# Patient Record
Sex: Female | Born: 1945 | Race: Asian | Hispanic: No | Marital: Married | Smoking: Never smoker
Health system: Southern US, Community
[De-identification: ages and names within clinical notes are randomized; demographics above are authoritative.]

## PROBLEM LIST (undated history)

## (undated) DIAGNOSIS — I10 Essential (primary) hypertension: Secondary | ICD-10-CM

## (undated) DIAGNOSIS — R17 Unspecified jaundice: Secondary | ICD-10-CM

## (undated) DIAGNOSIS — E782 Mixed hyperlipidemia: Secondary | ICD-10-CM

## (undated) DIAGNOSIS — C259 Malignant neoplasm of pancreas, unspecified: Secondary | ICD-10-CM

## (undated) HISTORY — PX: NO PAST SURGERIES: SHX2092

## (undated) HISTORY — DX: Mixed hyperlipidemia: E78.2

## (undated) HISTORY — DX: Essential (primary) hypertension: I10

## (undated) HISTORY — PX: OTHER SURGICAL HISTORY: SHX169

---

## 1998-03-11 ENCOUNTER — Ambulatory Visit (HOSPITAL_COMMUNITY): Admission: RE | Admit: 1998-03-11 | Discharge: 1998-03-11 | Payer: Self-pay | Admitting: Obstetrics and Gynecology

## 1999-04-09 ENCOUNTER — Encounter: Payer: Self-pay | Admitting: Obstetrics and Gynecology

## 1999-04-09 ENCOUNTER — Ambulatory Visit (HOSPITAL_COMMUNITY): Admission: RE | Admit: 1999-04-09 | Discharge: 1999-04-09 | Payer: Self-pay | Admitting: Obstetrics and Gynecology

## 1999-04-16 ENCOUNTER — Ambulatory Visit (HOSPITAL_COMMUNITY): Admission: RE | Admit: 1999-04-16 | Discharge: 1999-04-16 | Payer: Self-pay | Admitting: Obstetrics and Gynecology

## 1999-04-16 ENCOUNTER — Encounter: Payer: Self-pay | Admitting: Obstetrics and Gynecology

## 1999-04-16 ENCOUNTER — Other Ambulatory Visit: Admission: RE | Admit: 1999-04-16 | Discharge: 1999-04-16 | Payer: Self-pay | Admitting: Obstetrics and Gynecology

## 1999-05-05 ENCOUNTER — Encounter: Payer: Self-pay | Admitting: General Surgery

## 1999-05-05 ENCOUNTER — Ambulatory Visit (HOSPITAL_COMMUNITY): Admission: RE | Admit: 1999-05-05 | Discharge: 1999-05-05 | Payer: Self-pay | Admitting: General Surgery

## 2000-04-28 ENCOUNTER — Other Ambulatory Visit: Admission: RE | Admit: 2000-04-28 | Discharge: 2000-04-28 | Payer: Self-pay | Admitting: Obstetrics and Gynecology

## 2000-05-05 ENCOUNTER — Encounter: Payer: Self-pay | Admitting: Obstetrics and Gynecology

## 2000-05-05 ENCOUNTER — Ambulatory Visit (HOSPITAL_COMMUNITY): Admission: RE | Admit: 2000-05-05 | Discharge: 2000-05-05 | Payer: Self-pay | Admitting: Obstetrics and Gynecology

## 2001-06-08 ENCOUNTER — Encounter: Payer: Self-pay | Admitting: Obstetrics and Gynecology

## 2001-06-08 ENCOUNTER — Ambulatory Visit (HOSPITAL_COMMUNITY): Admission: RE | Admit: 2001-06-08 | Discharge: 2001-06-08 | Payer: Self-pay | Admitting: Obstetrics and Gynecology

## 2001-06-28 ENCOUNTER — Other Ambulatory Visit: Admission: RE | Admit: 2001-06-28 | Discharge: 2001-06-28 | Payer: Self-pay | Admitting: Obstetrics and Gynecology

## 2003-06-01 ENCOUNTER — Ambulatory Visit (HOSPITAL_COMMUNITY): Admission: RE | Admit: 2003-06-01 | Discharge: 2003-06-01 | Payer: Self-pay | Admitting: Obstetrics and Gynecology

## 2003-06-01 ENCOUNTER — Encounter: Payer: Self-pay | Admitting: Obstetrics and Gynecology

## 2005-05-05 ENCOUNTER — Other Ambulatory Visit: Admission: RE | Admit: 2005-05-05 | Discharge: 2005-05-05 | Payer: Self-pay | Admitting: Obstetrics & Gynecology

## 2010-10-18 LAB — LIPID PANEL
Cholesterol: 284 mg/dL — AB (ref 0–200)
HDL: 56 mg/dL (ref 35–70)
LDL Cholesterol: 171 mg/dL
Triglycerides: 286 mg/dL — AB (ref 40–160)

## 2011-08-18 ENCOUNTER — Ambulatory Visit: Payer: Self-pay | Admitting: Family Medicine

## 2011-08-18 DIAGNOSIS — E782 Mixed hyperlipidemia: Secondary | ICD-10-CM

## 2011-08-18 DIAGNOSIS — I10 Essential (primary) hypertension: Secondary | ICD-10-CM

## 2011-08-26 ENCOUNTER — Encounter: Payer: Self-pay | Admitting: Family Medicine

## 2011-08-26 ENCOUNTER — Ambulatory Visit: Payer: Self-pay | Admitting: Family Medicine

## 2011-08-26 DIAGNOSIS — I10 Essential (primary) hypertension: Secondary | ICD-10-CM

## 2011-08-26 DIAGNOSIS — E782 Mixed hyperlipidemia: Secondary | ICD-10-CM | POA: Insufficient documentation

## 2011-10-07 ENCOUNTER — Ambulatory Visit: Payer: Self-pay | Admitting: Family Medicine

## 2011-11-12 ENCOUNTER — Other Ambulatory Visit: Payer: Self-pay | Admitting: Physician Assistant

## 2011-11-23 ENCOUNTER — Other Ambulatory Visit: Payer: Self-pay | Admitting: Physician Assistant

## 2011-11-23 ENCOUNTER — Other Ambulatory Visit: Payer: Self-pay | Admitting: Internal Medicine

## 2014-12-24 DIAGNOSIS — Z1231 Encounter for screening mammogram for malignant neoplasm of breast: Secondary | ICD-10-CM | POA: Diagnosis not present

## 2015-08-27 DIAGNOSIS — Z79891 Long term (current) use of opiate analgesic: Secondary | ICD-10-CM | POA: Diagnosis not present

## 2015-08-27 DIAGNOSIS — E789 Disorder of lipoprotein metabolism, unspecified: Secondary | ICD-10-CM | POA: Diagnosis not present

## 2015-08-27 DIAGNOSIS — R7989 Other specified abnormal findings of blood chemistry: Secondary | ICD-10-CM | POA: Diagnosis not present

## 2015-08-27 DIAGNOSIS — I10 Essential (primary) hypertension: Secondary | ICD-10-CM | POA: Diagnosis not present

## 2015-08-27 DIAGNOSIS — Z79899 Other long term (current) drug therapy: Secondary | ICD-10-CM | POA: Diagnosis not present

## 2015-09-09 ENCOUNTER — Other Ambulatory Visit: Payer: Self-pay | Admitting: Gastroenterology

## 2015-09-09 DIAGNOSIS — R740 Nonspecific elevation of levels of transaminase and lactic acid dehydrogenase [LDH]: Secondary | ICD-10-CM | POA: Diagnosis not present

## 2015-09-09 DIAGNOSIS — R7402 Elevation of levels of lactic acid dehydrogenase (LDH): Secondary | ICD-10-CM

## 2015-09-09 DIAGNOSIS — R17 Unspecified jaundice: Secondary | ICD-10-CM | POA: Diagnosis not present

## 2015-09-09 DIAGNOSIS — L298 Other pruritus: Secondary | ICD-10-CM | POA: Diagnosis not present

## 2015-09-10 ENCOUNTER — Ambulatory Visit
Admission: RE | Admit: 2015-09-10 | Discharge: 2015-09-10 | Disposition: A | Discharge status: Home / Self Care | Payer: Medicare Other | Source: Ambulatory Visit | Attending: Gastroenterology | Admitting: Gastroenterology

## 2015-09-10 DIAGNOSIS — R7402 Elevation of levels of lactic acid dehydrogenase (LDH): Secondary | ICD-10-CM

## 2015-09-10 DIAGNOSIS — R17 Unspecified jaundice: Secondary | ICD-10-CM

## 2015-09-10 DIAGNOSIS — R740 Nonspecific elevation of levels of transaminase and lactic acid dehydrogenase [LDH]: Secondary | ICD-10-CM

## 2015-09-10 DIAGNOSIS — R945 Abnormal results of liver function studies: Secondary | ICD-10-CM | POA: Diagnosis not present

## 2015-09-10 MED ORDER — IOPAMIDOL (ISOVUE-300) INJECTION 61%
100.0000 mL | Freq: Once | INTRAVENOUS | Status: AC | PRN
  Administered 2015-09-10: 100 mL via INTRAVENOUS

## 2015-09-11 ENCOUNTER — Other Ambulatory Visit: Payer: Self-pay | Admitting: Gastroenterology

## 2015-09-11 ENCOUNTER — Encounter (HOSPITAL_COMMUNITY): Payer: Self-pay | Admitting: *Deleted

## 2015-09-11 NOTE — Progress Notes (Signed)
KOREAN INTERPRETER ARRANGED ARRIVE 830 09-13-15 [facility] ADMITTING, EMAIL CONFIRMATION ON CHART

## 2015-09-12 NOTE — Anesthesia Preprocedure Evaluation (Addendum)
Anesthesia Evaluation  Patient identified by MRN, date of birth, ID band Patient awake    Reviewed: Allergy & Precautions, NPO status , Patient's Chart, lab work & pertinent test results  Airway Mallampati: II   Neck ROM: Full    Dental  (+) Dental Advisory Given, Teeth Intact   Pulmonary neg pulmonary ROS,    breath sounds clear to auscultation       Cardiovascular hypertension, Pt. on medications  Rhythm:Regular     Neuro/Psych negative neurological ROS  negative psych ROS   GI/Hepatic negative GI ROS, Neg liver ROS,   Endo/Other  negative endocrine ROSPancreatic mass  Renal/GU negative Renal ROS  negative genitourinary   Musculoskeletal negative musculoskeletal ROS (+)   Abdominal (+)  Abdomen: soft.    Peds negative pediatric ROS (+)  Hematology negative hematology ROS (+)   Anesthesia Other Findings Interpreter present  Reproductive/Obstetrics negative OB ROS                            Anesthesia Physical Anesthesia Plan  ASA: II  Anesthesia Plan: General   Post-op Pain Management:    Induction: Intravenous  Airway Management Planned: Oral ETT  Additional Equipment:   Intra-op Plan:   Post-operative Plan:   Informed Consent: I have reviewed the patients History and Physical, chart, labs and discussed the procedure including the risks, benefits and alternatives for the proposed anesthesia with the patient or authorized representative who has indicated his/her understanding and acceptance.     Plan Discussed with:   Anesthesia Plan Comments:         Anesthesia Quick Evaluation

## 2015-09-13 ENCOUNTER — Ambulatory Visit (HOSPITAL_COMMUNITY): Payer: Medicare Other | Admitting: Anesthesiology

## 2015-09-13 ENCOUNTER — Encounter (HOSPITAL_COMMUNITY): Payer: Self-pay | Admitting: Registered Nurse

## 2015-09-13 ENCOUNTER — Ambulatory Visit (HOSPITAL_COMMUNITY): Payer: Medicare Other

## 2015-09-13 ENCOUNTER — Ambulatory Visit (HOSPITAL_COMMUNITY)
Admission: RE | Admit: 2015-09-13 | Discharge: 2015-09-13 | Disposition: A | Discharge status: Home / Self Care | Payer: Medicare Other | Source: Ambulatory Visit | Attending: Gastroenterology | Admitting: Gastroenterology

## 2015-09-13 ENCOUNTER — Encounter (HOSPITAL_COMMUNITY)
Admission: RE | Disposition: A | Discharge status: Home / Self Care | Payer: Self-pay | Source: Ambulatory Visit | Attending: Gastroenterology

## 2015-09-13 ENCOUNTER — Encounter (HOSPITAL_COMMUNITY): Payer: Self-pay | Admitting: *Deleted

## 2015-09-13 DIAGNOSIS — E782 Mixed hyperlipidemia: Secondary | ICD-10-CM | POA: Diagnosis not present

## 2015-09-13 DIAGNOSIS — I10 Essential (primary) hypertension: Secondary | ICD-10-CM | POA: Insufficient documentation

## 2015-09-13 DIAGNOSIS — K831 Obstruction of bile duct: Secondary | ICD-10-CM | POA: Diagnosis not present

## 2015-09-13 DIAGNOSIS — R932 Abnormal findings on diagnostic imaging of liver and biliary tract: Secondary | ICD-10-CM | POA: Diagnosis not present

## 2015-09-13 DIAGNOSIS — L298 Other pruritus: Secondary | ICD-10-CM | POA: Diagnosis not present

## 2015-09-13 DIAGNOSIS — K8689 Other specified diseases of pancreas: Secondary | ICD-10-CM | POA: Diagnosis not present

## 2015-09-13 DIAGNOSIS — R17 Unspecified jaundice: Secondary | ICD-10-CM | POA: Diagnosis not present

## 2015-09-13 HISTORY — DX: Unspecified jaundice: R17

## 2015-09-13 HISTORY — PX: ERCP: SHX5425

## 2015-09-13 SURGERY — ERCP, WITH INTERVENTION IF INDICATED
Anesthesia: General

## 2015-09-13 MED ORDER — IOHEXOL 350 MG/ML SOLN
INTRAVENOUS | Status: DC | PRN
  Administered 2015-09-13: 30 mL

## 2015-09-13 MED ORDER — CIPROFLOXACIN IN D5W 400 MG/200ML IV SOLN
INTRAVENOUS | Status: AC
  Filled 2015-09-13: qty 200

## 2015-09-13 MED ORDER — INDOMETHACIN 50 MG RE SUPP
RECTAL | Status: AC
  Filled 2015-09-13: qty 2

## 2015-09-13 MED ORDER — LIDOCAINE HCL (CARDIAC) 20 MG/ML IV SOLN
INTRAVENOUS | Status: DC | PRN
  Administered 2015-09-13: 25 mg via INTRATRACHEAL
  Administered 2015-09-13: 75 mg via INTRAVENOUS

## 2015-09-13 MED ORDER — LACTATED RINGERS IV SOLN
INTRAVENOUS | Status: DC | PRN
  Administered 2015-09-13 (×2): via INTRAVENOUS

## 2015-09-13 MED ORDER — PROPOFOL 10 MG/ML IV BOLUS
INTRAVENOUS | Status: AC
  Filled 2015-09-13: qty 60

## 2015-09-13 MED ORDER — SUCCINYLCHOLINE CHLORIDE 20 MG/ML IJ SOLN
INTRAMUSCULAR | Status: DC | PRN
  Administered 2015-09-13: 80 mg via INTRAVENOUS

## 2015-09-13 MED ORDER — ONDANSETRON HCL 4 MG/2ML IJ SOLN
INTRAMUSCULAR | Status: DC | PRN
  Administered 2015-09-13: 4 mg via INTRAVENOUS

## 2015-09-13 MED ORDER — CIPROFLOXACIN IN D5W 400 MG/200ML IV SOLN
INTRAVENOUS | Status: DC | PRN
  Administered 2015-09-13: 400 mg via INTRAVENOUS

## 2015-09-13 MED ORDER — INDOMETHACIN 50 MG RE SUPP
100.0000 mg | Freq: Once | RECTAL | Status: AC
  Administered 2015-09-13: 100 mg via RECTAL

## 2015-09-13 MED ORDER — SODIUM CHLORIDE 0.9 % IV SOLN: INTRAVENOUS | Status: DC

## 2015-09-13 MED ORDER — PROPOFOL 10 MG/ML IV BOLUS
INTRAVENOUS | Status: DC | PRN
  Administered 2015-09-13: 150 mg via INTRAVENOUS

## 2015-09-13 MED ORDER — GLUCAGON HCL RDNA (DIAGNOSTIC) 1 MG IJ SOLR
INTRAMUSCULAR | Status: AC
  Filled 2015-09-13: qty 1

## 2015-09-13 MED ORDER — LACTATED RINGERS IV SOLN
INTRAVENOUS | Status: DC
  Administered 2015-09-13: 1000 mL via INTRAVENOUS

## 2015-09-13 NOTE — Transfer of Care (Signed)
Immediate Anesthesia Transfer of Care Note  Patient: Kristin Valentine  Procedure(s) Performed: Procedure(s): ENDOSCOPIC RETROGRADE CHOLANGIOPANCREATOGRAPHY (ERCP) (N/A)  Patient Location: PACU  Anesthesia Type:General  Level of Consciousness: awake, alert  and patient cooperative  Airway & Oxygen Therapy: Patient Spontanous Breathing and Patient connected to face mask oxygen  Post-op Assessment: Report given to RN and Post -op Vital signs reviewed and stable  Post vital signs: Reviewed and stable  Last Vitals:  Filed Vitals:   09/13/15 0907  BP: 158/102  Pulse: 94  Temp: 36.6 C  Resp: 21    Complications: No apparent anesthesia complications

## 2015-09-13 NOTE — Op Note (Signed)
[facility] [place], [address]   ERCP PROCEDURE REPORT        EXAM DATE: September 30, 2015  PATIENT NAME:          Kristin Valentine, Kristin Valentine          MR #:        [id number]  BIRTHDATE:       [birth year]     VISIT #:     [id number] ATTENDING:     Carol Ada, MD     STATUS:     outpatient ASSISTANT:      Markus Daft, and Jiles Harold  INDICATIONS:  The patient is a 70 yr old female here for an ERCP due to obstructive jaundice PROCEDURE PERFORMED:     ERCP with stent placement MEDICATIONS:     General Anesthesia, Cipro 400 mg IV, and Indomethacin 100 mg PR  CONSENT: The patient understands the risks and benefits of the procedure and understands that these risks include, but are not limited to: sedation, allergic reaction, infection, perforation and/or bleeding. Alternative means of evaluation and treatment include, among others: physical exam, x-rays, and/or surgical intervention. The patient elects to proceed with this endoscopic procedure.  DESCRIPTION OF PROCEDURE: During intra-op preparation period all mechanical & medical equipment was checked for proper function. Hand hygiene and appropriate measures for infection prevention was taken. After the risks, benefits and alternatives of the procedure were thoroughly explained, Informed was verified, confirmed and timeout was successfully executed by the treatment team. With the patient in left semi-prone position, medications were administered intravenously.The    was passed from the mouth into the esophagus and further advanced from the esophagus into the stomach. From stomach scope was directed to the second portion of the duodenum. Major papilla was aligned with the duodenoscope. The scope position was confirmed fluoroscopically. Rest of the findings/therapeutics are given below. The scope was then completely withdrawn from the patient and the procedure completed. The pulse,  BP, and O2 saturation were monitored and documented by the physician and the nursing staff throughout the entire procedure. The patient was cared for as planned according to standard protocol. The patient was then discharged to recovery in stable condition and with appropriate post procedure care. Estimated blood loss is zero unless otherwise noted in this procedure report.  The ampulla was located the second portion of the duodenum and there was a periampullary diverticulum.  After several attempts the PD was cannulated with the guidewire and left in the PD.  A sphincterotomy was created and then a secondary guidewire was used. with gentle manipulation and maneuvering the CBD was successfully cannulated.  The guidewire was advanced to the proximal CBD.  It was not able to be advanced further as the wire continued to loop. Contrast injection revealed a dilated proximal and mid CBD to 2 cm. The sphincterotomy was extended and then an 8.5 Fr x 5 cm plastic bilary stent was inserted without difficulty.  Immediate drainage of the CBD was achieved.  Fluoroscopy documented excellent placement of the stent across the extrinsic obstruction.    ADVERSE EVENT:     There were no immediate complications. IMPRESSIONS:     1) Extrinsic compression of the CBD s/p successful 8.5 Fr x 5 cm biliary stent placement.  RECOMMENDATIONS:     1) Schedule EUS with FNA. REPEAT EXAM:   ___________________________________ Carol Ada, MD eSigned:  Carol Ada, MD 09/30/15 11:06 AM   cc:  CPT CODES: ICD9 CODES:

## 2015-09-13 NOTE — Anesthesia Postprocedure Evaluation (Signed)
Anesthesia Post Note  Patient: Kristin Valentine  Procedure(s) Performed: Procedure(s) (LRB): ENDOSCOPIC RETROGRADE CHOLANGIOPANCREATOGRAPHY (ERCP) (N/A)  Patient location during evaluation: PACU Anesthesia Type: General Level of consciousness: awake and alert Pain management: pain level controlled Vital Signs Assessment: post-procedure vital signs reviewed and stable Respiratory status: spontaneous breathing, nonlabored ventilation, respiratory function stable and patient connected to nasal cannula oxygen Cardiovascular status: blood pressure returned to baseline and stable Postop Assessment: no signs of nausea or vomiting Anesthetic complications: no    Last Vitals:  Filed Vitals:   09/13/15 1120 09/13/15 1130  BP: 154/92 149/95  Pulse: 71 68  Temp:    Resp: 16 16    Last Pain: There were no vitals filed for this visit.               Demisha Nokes

## 2015-09-13 NOTE — Anesthesia Procedure Notes (Signed)
Procedure Name: Intubation Date/Time: 09/13/2015 10:09 AM Performed by: Lissa Morales Pre-anesthesia Checklist: Patient identified, Emergency Drugs available, Suction available and Patient being monitored Patient Re-evaluated:Patient Re-evaluated prior to inductionOxygen Delivery Method: Circle System Utilized Preoxygenation: Pre-oxygenation with 100% oxygen Intubation Type: IV induction Ventilation: Mask ventilation without difficulty Laryngoscope Size: Mac and 4 Grade View: Grade II Tube type: Oral Number of attempts: 1 Airway Equipment and Method: Stylet and Oral airway Placement Confirmation: ETT inserted through vocal cords under direct vision,  positive ETCO2 and breath sounds checked- equal and bilateral Secured at: 20 cm Tube secured with: Tape Dental Injury: Teeth and Oropharynx as per pre-operative assessment

## 2015-09-13 NOTE — H&P (Signed)
  Kristin Valentine HPI: This is a 70 year old female with painless jaundice.  Initially she presented with abnormal liver enzymes and then she quickly developed pruritis.  Repeat blood work confirmed the elevation in her bilirubin at 7.0.  CT scan reveals a double duct sign and a subtle hypoenhancing mass in the head of the pancreas.  No reports of any fever.  Past Medical History  Diagnosis Date  . HTN (hypertension)   . Hyperlipidemia, mixed   . Yellow skin for last week    Past Surgical History  Procedure Laterality Date  . No past surgeries      Family History  Problem Relation Age of Onset  . Hypertension Mother     Social History:  reports that she has never smoked. She has never used smokeless tobacco. She reports that she does not drink alcohol or use illicit drugs.  Allergies: No Known Allergies  Medications:  Scheduled:  Continuous: . sodium chloride    . lactated ringers      No results found for this or any previous visit (from the past 24 hour(s)).   No results found.  ROS:  As stated above in the HPI otherwise negative.  There were no vitals taken for this visit.    PE: Gen: NAD, Alert and Oriented HEENT:  [place]/AT, EOMI Neck: Supple, no LAD Lungs: CTA Bilaterally CV: RRR without M/G/R ABM: Soft, NTND, +BS Ext: No C/C/E  Assessment/Plan: 1) Obstructive jaundice.  Plan: 1) ERCP with stenting.  Kae Lauman D 09/13/2015, 9:04 AM

## 2015-09-16 ENCOUNTER — Encounter (HOSPITAL_COMMUNITY): Payer: Self-pay | Admitting: Gastroenterology

## 2015-09-19 ENCOUNTER — Encounter (HOSPITAL_COMMUNITY): Payer: Self-pay

## 2015-09-19 ENCOUNTER — Ambulatory Visit (HOSPITAL_COMMUNITY)
Admission: RE | Admit: 2015-09-19 | Discharge: 2015-09-19 | Disposition: A | Discharge status: Home / Self Care | Payer: Medicare Other | Source: Ambulatory Visit | Attending: Gastroenterology | Admitting: Gastroenterology

## 2015-09-19 ENCOUNTER — Ambulatory Visit (HOSPITAL_COMMUNITY): Payer: Medicare Other | Admitting: Certified Registered"

## 2015-09-19 ENCOUNTER — Encounter (HOSPITAL_COMMUNITY)
Admission: RE | Disposition: A | Discharge status: Home / Self Care | Payer: Self-pay | Source: Ambulatory Visit | Attending: Gastroenterology

## 2015-09-19 DIAGNOSIS — I10 Essential (primary) hypertension: Secondary | ICD-10-CM | POA: Insufficient documentation

## 2015-09-19 DIAGNOSIS — R17 Unspecified jaundice: Secondary | ICD-10-CM | POA: Diagnosis not present

## 2015-09-19 DIAGNOSIS — K831 Obstruction of bile duct: Secondary | ICD-10-CM | POA: Insufficient documentation

## 2015-09-19 DIAGNOSIS — R932 Abnormal findings on diagnostic imaging of liver and biliary tract: Secondary | ICD-10-CM | POA: Diagnosis not present

## 2015-09-19 DIAGNOSIS — C250 Malignant neoplasm of head of pancreas: Secondary | ICD-10-CM | POA: Diagnosis not present

## 2015-09-19 DIAGNOSIS — E782 Mixed hyperlipidemia: Secondary | ICD-10-CM | POA: Insufficient documentation

## 2015-09-19 DIAGNOSIS — K8689 Other specified diseases of pancreas: Secondary | ICD-10-CM | POA: Diagnosis not present

## 2015-09-19 HISTORY — PX: EUS: SHX5427

## 2015-09-19 HISTORY — PX: FINE NEEDLE ASPIRATION: SHX5430

## 2015-09-19 SURGERY — ESOPHAGEAL ENDOSCOPIC ULTRASOUND (EUS) RADIAL
Anesthesia: Monitor Anesthesia Care

## 2015-09-19 MED ORDER — PROPOFOL 500 MG/50ML IV EMUL
INTRAVENOUS | Status: DC | PRN
  Administered 2015-09-19: 150 ug/kg/min via INTRAVENOUS

## 2015-09-19 MED ORDER — PROPOFOL 10 MG/ML IV BOLUS
INTRAVENOUS | Status: AC
  Filled 2015-09-19: qty 20

## 2015-09-19 MED ORDER — LIDOCAINE HCL (CARDIAC) 20 MG/ML IV SOLN
INTRAVENOUS | Status: DC | PRN
  Administered 2015-09-19 (×3): 40 mg via INTRAVENOUS
  Administered 2015-09-19: 20 mg via INTRAVENOUS

## 2015-09-19 MED ORDER — PROPOFOL 10 MG/ML IV BOLUS
INTRAVENOUS | Status: DC | PRN
  Administered 2015-09-19: 50 mg via INTRAVENOUS
  Administered 2015-09-19: 40 mg via INTRAVENOUS

## 2015-09-19 MED ORDER — ONDANSETRON HCL 4 MG/2ML IJ SOLN
INTRAMUSCULAR | Status: DC | PRN
  Administered 2015-09-19 (×2): 2 mg via INTRAVENOUS

## 2015-09-19 MED ORDER — FENTANYL CITRATE (PF) 100 MCG/2ML IJ SOLN: 25.0000 ug | INTRAMUSCULAR | Status: DC | PRN

## 2015-09-19 MED ORDER — LIDOCAINE HCL (CARDIAC) 20 MG/ML IV SOLN
INTRAVENOUS | Status: AC
  Filled 2015-09-19: qty 5

## 2015-09-19 MED ORDER — ONDANSETRON HCL 4 MG/2ML IJ SOLN
INTRAMUSCULAR | Status: AC
  Filled 2015-09-19: qty 2

## 2015-09-19 MED ORDER — LACTATED RINGERS IV SOLN
INTRAVENOUS | Status: DC
  Administered 2015-09-19: 15:00:00 via INTRAVENOUS
  Administered 2015-09-19: 1000 mL via INTRAVENOUS

## 2015-09-19 NOTE — Anesthesia Preprocedure Evaluation (Signed)
Anesthesia Evaluation  Patient identified by MRN, date of birth, ID band Patient awake    Reviewed: Allergy & Precautions, NPO status , Patient's Chart, lab work & pertinent test results  Airway Mallampati: II   Neck ROM: Full    Dental  (+) Dental Advisory Given, Teeth Intact   Pulmonary neg pulmonary ROS,    Pulmonary exam normal breath sounds clear to auscultation       Cardiovascular hypertension, Pt. on medications Normal cardiovascular exam Rhythm:Regular     Neuro/Psych negative neurological ROS  negative psych ROS   GI/Hepatic negative GI ROS, Neg liver ROS,   Endo/Other  negative endocrine ROSPancreatic mass  Renal/GU negative Renal ROS  negative genitourinary   Musculoskeletal negative musculoskeletal ROS (+)   Abdominal (+)  Abdomen: soft.    Peds negative pediatric ROS (+)  Hematology negative hematology ROS (+)   Anesthesia Other Findings Interpreter present  Reproductive/Obstetrics negative OB ROS                             Anesthesia Physical Anesthesia Plan  ASA: II  Anesthesia Plan: MAC   Post-op Pain Management:    Induction:   Airway Management Planned:   Additional Equipment:   Intra-op Plan:   Post-operative Plan:   Informed Consent:   Plan Discussed with:   Anesthesia Plan Comments:         Anesthesia Quick Evaluation

## 2015-09-19 NOTE — Transfer of Care (Signed)
Immediate Anesthesia Transfer of Care Note  Patient: Kristin Valentine  Procedure(s) Performed: Procedure(s): ESOPHAGEAL ENDOSCOPIC ULTRASOUND (EUS) RADIAL (N/A) FINE NEEDLE ASPIRATION (FNA) LINEAR (N/A)  Patient Location: PACU and Endoscopy Unit  Anesthesia Type:MAC  Level of Consciousness: awake and patient cooperative  Airway & Oxygen Therapy: Patient Spontanous Breathing and Patient connected to nasal cannula oxygen  Post-op Assessment: Report given to RN and Post -op Vital signs reviewed and stable  Post vital signs: Reviewed and stable  Last Vitals:  Filed Vitals:   09/19/15 1522 09/19/15 1523  BP: 78/48 85/50  Pulse: 81   Temp: 36.7 C   Resp: 21     Complications: No apparent anesthesia complications

## 2015-09-19 NOTE — Discharge Instructions (Signed)

## 2015-09-19 NOTE — Op Note (Signed)
[facility] [place], [address]   UPPER ENDOSCOPIC ULTRASOUND PROCEDURE REPORT     EXAM DATE: 09/19/2015  PATIENT NAME:          Kristin Valentine, Kristin Valentine          MR#:        [id number]  BIRTHDATE:       [birth year]     VISIT #:     [phone] ATTENDING:     Carol Ada, MD     STATUS:     outpatient ASSISTANT:      Cherylynn Ridges and Carolynn Comment  REFERRING MD: ASA CLASS:        Class II  INDICATIONS:  The patient is a 70 yr old female here for a lower endoscopic ultrasound due to obstructive jaundice. PROCEDURE PERFORMED:     Upper EUS w/FNA  MEDICATIONS:     Monitored anesthesia care  CONSENT: The patient understands the risks and benefits of the procedure and understands that these risks include, but are not limited to: sedation, allergic reaction, infection, perforation and/or bleeding. Alternative means of evaluation and treatment include, among others: physical exam, x-rays, and/or surgical intervention. The patient elects to proceed with this endoscopic procedure.  DESCRIPTION OF PROCEDURE: During intra-op preparation period all mechanical & medical equipment was checked for proper function. Hand hygiene and appropriate measures for infection prevention was taken. After the risks, benefits and alternatives of the procedure were thoroughly explained, Informed consent was verified, confirmed and timeout was successfully executed by the treatment team. The patient was then placed in the left, lateral, decubitus position and IV sedation was administered. Throughout the procedure, the patients blood pressure, pulse and oxygen saturations were monitored continuously. Under direct visualization, the [id number]) was introduced through the mouth and advanced to the second portion of the duodenum.  Water was used as necessary to provide an acoustic interface. The pulse, BP, and O2 saturation were monitored and documented by the  physician and nursing staff throughout the procedure. Upon completion of the imaging, water was removed and the patient was then discharged to recovery in stable condition with the appropriate post procedure care. Estimated blood loss is zero unless otherwise noted in this procedure report.   FINDINGS: Technical difficulties precluded obtaining ultrasound views during the procedure.  The echoendoscope was passed into the gastric lumen.  Endoechoscopic evaluation revealed a normal Celiac axis.  The left lobe of the liver was negative for any evidence of metastases.  An attempt was made to evaluate the left adrenal gland, but it was not able to be identified.  The pancreatic blody and tail of the pancreas exhibited a marked dilation of the PD at 6 mm.  From the duodenal bulb the PD dilation was again identified. It was difficult to localize the CBD from this location.  The scope was then passed into the second portion of the duodenum.  The plastic biliary stent was in place and good drainage was observed, however, the stent appeared to have slipped down.  On the ultrasound view a large irregularly bordered hypoechoic mass was identified.  The measurements ranged from 3 x 3 cm up to 3 x 6 cm. It was difficult to discern the margins of the mass in certain spots.  The biliary stent was identified and it was tracking through the mass in the CBD.  The mass was located in the uncinate/pancreatic head.  There was no evidence of vascular invasion into the portal vein.  A 22 gauge FNA needle was used and an excellent sample was obtained.  Doppler was used to ensure no significant vessels were enountered and the depth of needle insertion was measured before the FNA.  Shortly afterwards a significant amount of fresh blood was identified.  The echoendoscope was changed out for an EGD scope and a large fresh clot was found in the duodenal bulb.  Using a snare the clot was broken down and suctioned.   A large portion was able to pulled into the gastric lumen.  Reinspection of the duodenal bulb was negative for a bleeding source and then the scope was advanced to the second portion of the duodenum.  A smaller clot was found.  At this point a duodenoscope was employed to obtain a better visualization.  Once the periampullary clot was removed a clean-based ulcer was found at the ampulla.  This may have been from stent flange irritation.  No further bleeding recurred.  The decision was then made to resume the EUS/FNA.  Using a 25 gauge FNA needle four additional passes into the mass were performed and excellent samples were obtained. Because of the bleed, the EGD scope was reinserted after the EUS/FNA to ensure that there was no further bleeding, which was the case.    ADVERSE EVENTS:     There were no immediate complications. IMPRESSIONS:     1) 3 x 3 cm and potentially 3 x 6 cm irregularly bordered hypoechoic mass in the uncinate/head of the pancreas.   RECOMMENDATIONS:     1) Await FNA results.  ___________________________________ Carol Ada, MD eSigned:  Carol Ada, MD 09/19/2015 3:39 PM   cc:      PATIENT NAME:  [name], [name] MR#: [id number]

## 2015-09-19 NOTE — Anesthesia Postprocedure Evaluation (Signed)
Anesthesia Post Note  Patient: Kristin Valentine  Procedure(s) Performed: Procedure(s) (LRB): ESOPHAGEAL ENDOSCOPIC ULTRASOUND (EUS) RADIAL (N/A) FINE NEEDLE ASPIRATION (FNA) LINEAR (N/A)  Patient location during evaluation: PACU Anesthesia Type: MAC Level of consciousness: awake and alert Pain management: pain level controlled Vital Signs Assessment: post-procedure vital signs reviewed and stable Respiratory status: spontaneous breathing Cardiovascular status: blood pressure returned to baseline Anesthetic complications: no    Last Vitals:  Filed Vitals:   09/19/15 1540 09/19/15 1550  BP: 118/72 118/74  Pulse: 80 83  Temp:    Resp: 26 16    Last Pain: There were no vitals filed for this visit.               Tiajuana Amass

## 2015-09-19 NOTE — H&P (View-Only) (Signed)
  Kristin Valentine HPI: This is a 70 year old female with painless jaundice.  Initially she presented with abnormal liver enzymes and then she quickly developed pruritis.  Repeat blood work confirmed the elevation in her bilirubin at 7.0.  CT scan reveals a double duct sign and a subtle hypoenhancing mass in the head of the pancreas.  No reports of any fever.  Past Medical History  Diagnosis Date  . HTN (hypertension)   . Hyperlipidemia, mixed   . Yellow skin for last week    Past Surgical History  Procedure Laterality Date  . No past surgeries      Family History  Problem Relation Age of Onset  . Hypertension Mother     Social History:  reports that she has never smoked. She has never used smokeless tobacco. She reports that she does not drink alcohol or use illicit drugs.  Allergies: No Known Allergies  Medications:  Scheduled:  Continuous: . sodium chloride    . lactated ringers      No results found for this or any previous visit (from the past 24 hour(s)).   No results found.  ROS:  As stated above in the HPI otherwise negative.  There were no vitals taken for this visit.    PE: Gen: NAD, Alert and Oriented HEENT:  [place]/AT, EOMI Neck: Supple, no LAD Lungs: CTA Bilaterally CV: RRR without M/G/R ABM: Soft, NTND, +BS Ext: No C/C/E  Assessment/Plan: 1) Obstructive jaundice.  Plan: 1) ERCP with stenting.  Kristin Valentine D 09/13/2015, 9:04 AM

## 2015-09-19 NOTE — Interval H&P Note (Signed)
History and Physical Interval Note:  09/19/2015 1:18 PM  Kristin Valentine  has presented today for surgery, with the diagnosis of pancreatic head mass   The various methods of treatment have been discussed with the patient and family. After consideration of risks, benefits and other options for treatment, the patient has consented to  Procedure(s): ESOPHAGEAL ENDOSCOPIC ULTRASOUND (EUS) RADIAL (N/A) FINE NEEDLE ASPIRATION (FNA) LINEAR (N/A) as a surgical intervention .  The patient's history has been reviewed, patient examined, no change in status, stable for surgery.  I have reviewed the patient's chart and labs.  Questions were answered to the patient's satisfaction.     [name] D

## 2015-09-20 ENCOUNTER — Encounter (HOSPITAL_COMMUNITY): Payer: Self-pay | Admitting: Gastroenterology

## 2015-09-23 DIAGNOSIS — C250 Malignant neoplasm of head of pancreas: Secondary | ICD-10-CM | POA: Diagnosis not present

## 2015-09-24 DIAGNOSIS — C250 Malignant neoplasm of head of pancreas: Secondary | ICD-10-CM | POA: Diagnosis not present

## 2015-09-30 ENCOUNTER — Emergency Department (HOSPITAL_COMMUNITY): Payer: Medicare Other

## 2015-09-30 ENCOUNTER — Encounter (HOSPITAL_COMMUNITY): Payer: Self-pay | Admitting: Emergency Medicine

## 2015-09-30 ENCOUNTER — Emergency Department (HOSPITAL_COMMUNITY)
Admission: EM | Admit: 2015-09-30 | Discharge: 2015-09-30 | Disposition: A | Discharge status: Home / Self Care | Payer: Medicare Other | Source: Home / Self Care | Attending: Emergency Medicine | Admitting: Emergency Medicine

## 2015-09-30 DIAGNOSIS — I10 Essential (primary) hypertension: Secondary | ICD-10-CM

## 2015-09-30 DIAGNOSIS — R05 Cough: Secondary | ICD-10-CM

## 2015-09-30 DIAGNOSIS — C259 Malignant neoplasm of pancreas, unspecified: Secondary | ICD-10-CM | POA: Insufficient documentation

## 2015-09-30 HISTORY — DX: Malignant neoplasm of pancreas, unspecified: C25.9

## 2015-09-30 LAB — COMPREHENSIVE METABOLIC PANEL
ALBUMIN: 3.3 g/dL — AB (ref 3.5–5.0)
ALT: 158 U/L — AB (ref 14–54)
AST: 91 U/L — AB (ref 15–41)
Alkaline Phosphatase: 567 U/L — ABNORMAL HIGH (ref 38–126)
Anion gap: 13 (ref 5–15)
BUN: 16 mg/dL (ref 6–20)
CHLORIDE: 87 mmol/L — AB (ref 101–111)
CO2: 24 mmol/L (ref 22–32)
CREATININE: 0.33 mg/dL — AB (ref 0.44–1.00)
Calcium: 8.8 mg/dL — ABNORMAL LOW (ref 8.9–10.3)
GFR calc Af Amer: >60 mL/min (ref >60)
GFR calc non Af Amer: >60 mL/min (ref >60)
Glucose, Bld: 173 mg/dL — ABNORMAL HIGH (ref 65–99)
Potassium: 3.5 mmol/L (ref 3.5–5.1)
SODIUM: 124 mmol/L — AB (ref 135–145)
Total Bilirubin: 10.6 mg/dL — ABNORMAL HIGH (ref 0.3–1.2)
Total Protein: 7.4 g/dL (ref 6.5–8.1)

## 2015-09-30 LAB — I-STAT CG4 LACTIC ACID, ED: LACTIC ACID, VENOUS: 1.08 mmol/L (ref 0.5–2.0)

## 2015-09-30 NOTE — ED Notes (Signed)
Attempted to call patient for assigned room. No answer.

## 2015-09-30 NOTE — ED Notes (Signed)
Bed: [id number] Expected date:  Expected time:  Means of arrival:  Comments: Renato Gails

## 2015-09-30 NOTE — ED Notes (Signed)
Pt's daughter called and is concerned that the Pt is having to wait.  This Agricultural consultant confirmed that the Pt does not receive chemo or radiation for CA diagnosis.  Pt's daughter demanding a time frame that the Pt will be seen.  Daughter informed of department status/census and that this Charge RN is unable to give a specific wait time.  Protocols ordered.

## 2015-09-30 NOTE — ED Notes (Signed)
Pt called for 3rd time with no response.

## 2015-09-30 NOTE — ED Notes (Signed)
Pt states that since Saturday she has had chest congestion and a cough along with chills. Afebrile. Pt recently dx with pancreatic CA. Alert and oriented.

## 2015-09-30 NOTE — ED Notes (Signed)
Attempted to call Pt to room w/o answer.

## 2015-10-01 ENCOUNTER — Telehealth (HOSPITAL_COMMUNITY): Payer: Self-pay | Admitting: *Deleted

## 2015-10-01 ENCOUNTER — Inpatient Hospital Stay (HOSPITAL_COMMUNITY)
Admission: EM | Admit: 2015-10-01 | Discharge: 2015-10-03 | DRG: 445 | Disposition: A | Discharge status: Home / Self Care | Payer: Medicare Other | Attending: Internal Medicine | Admitting: Internal Medicine

## 2015-10-01 DIAGNOSIS — K831 Obstruction of bile duct: Principal | ICD-10-CM | POA: Diagnosis present

## 2015-10-01 DIAGNOSIS — R7989 Other specified abnormal findings of blood chemistry: Secondary | ICD-10-CM | POA: Diagnosis not present

## 2015-10-01 DIAGNOSIS — E871 Hypo-osmolality and hyponatremia: Secondary | ICD-10-CM | POA: Diagnosis not present

## 2015-10-01 DIAGNOSIS — C259 Malignant neoplasm of pancreas, unspecified: Secondary | ICD-10-CM | POA: Diagnosis not present

## 2015-10-01 DIAGNOSIS — R17 Unspecified jaundice: Secondary | ICD-10-CM | POA: Diagnosis present

## 2015-10-01 DIAGNOSIS — R740 Nonspecific elevation of levels of transaminase and lactic acid dehydrogenase [LDH]: Secondary | ICD-10-CM | POA: Diagnosis not present

## 2015-10-01 DIAGNOSIS — L299 Pruritus, unspecified: Secondary | ICD-10-CM | POA: Diagnosis not present

## 2015-10-01 DIAGNOSIS — R05 Cough: Secondary | ICD-10-CM | POA: Diagnosis not present

## 2015-10-01 DIAGNOSIS — J208 Acute bronchitis due to other specified organisms: Secondary | ICD-10-CM | POA: Diagnosis present

## 2015-10-01 DIAGNOSIS — R935 Abnormal findings on diagnostic imaging of other abdominal regions, including retroperitoneum: Secondary | ICD-10-CM | POA: Diagnosis not present

## 2015-10-01 DIAGNOSIS — B954 Other streptococcus as the cause of diseases classified elsewhere: Secondary | ICD-10-CM | POA: Diagnosis not present

## 2015-10-01 DIAGNOSIS — E782 Mixed hyperlipidemia: Secondary | ICD-10-CM | POA: Diagnosis not present

## 2015-10-01 DIAGNOSIS — E860 Dehydration: Secondary | ICD-10-CM | POA: Diagnosis present

## 2015-10-01 DIAGNOSIS — K830 Cholangitis: Secondary | ICD-10-CM | POA: Diagnosis not present

## 2015-10-01 DIAGNOSIS — I10 Essential (primary) hypertension: Secondary | ICD-10-CM | POA: Diagnosis present

## 2015-10-01 DIAGNOSIS — Z95828 Presence of other vascular implants and grafts: Secondary | ICD-10-CM | POA: Diagnosis not present

## 2015-10-01 DIAGNOSIS — D649 Anemia, unspecified: Secondary | ICD-10-CM | POA: Diagnosis present

## 2015-10-01 DIAGNOSIS — R739 Hyperglycemia, unspecified: Secondary | ICD-10-CM | POA: Diagnosis present

## 2015-10-01 DIAGNOSIS — R918 Other nonspecific abnormal finding of lung field: Secondary | ICD-10-CM | POA: Diagnosis not present

## 2015-10-01 DIAGNOSIS — R7881 Bacteremia: Secondary | ICD-10-CM | POA: Diagnosis not present

## 2015-10-01 DIAGNOSIS — Z8249 Family history of ischemic heart disease and other diseases of the circulatory system: Secondary | ICD-10-CM | POA: Diagnosis not present

## 2015-10-01 DIAGNOSIS — Z79899 Other long term (current) drug therapy: Secondary | ICD-10-CM | POA: Diagnosis not present

## 2015-10-01 DIAGNOSIS — K838 Other specified diseases of biliary tract: Secondary | ICD-10-CM | POA: Diagnosis not present

## 2015-10-01 DIAGNOSIS — Z4689 Encounter for fitting and adjustment of other specified devices: Secondary | ICD-10-CM

## 2015-10-01 DIAGNOSIS — R059 Cough, unspecified: Secondary | ICD-10-CM | POA: Diagnosis present

## 2015-10-01 DIAGNOSIS — C250 Malignant neoplasm of head of pancreas: Secondary | ICD-10-CM | POA: Diagnosis not present

## 2015-10-01 LAB — CBC
HCT: 27.7 % — ABNORMAL LOW (ref 36.0–46.0)
Hemoglobin: 9.8 g/dL — ABNORMAL LOW (ref 12.0–15.0)
MCH: 30 pg (ref 26.0–34.0)
MCHC: 35.4 g/dL (ref 30.0–36.0)
MCV: 84.7 fL (ref 78.0–100.0)
PLATELETS: 229 10*3/uL (ref 150–400)
RBC: 3.27 MIL/uL — ABNORMAL LOW (ref 3.87–5.11)
RDW: 15 % (ref 11.5–15.5)
WBC: 8.2 10*3/uL (ref 4.0–10.5)

## 2015-10-01 LAB — COMPREHENSIVE METABOLIC PANEL
ALK PHOS: 502 U/L — AB (ref 38–126)
ALT: 104 U/L — AB (ref 14–54)
AST: 53 U/L — ABNORMAL HIGH (ref 15–41)
Albumin: 2.9 g/dL — ABNORMAL LOW (ref 3.5–5.0)
Anion gap: 12 (ref 5–15)
BILIRUBIN TOTAL: 11.8 mg/dL — AB (ref 0.3–1.2)
BUN: 17 mg/dL (ref 6–20)
CALCIUM: 8.6 mg/dL — AB (ref 8.9–10.3)
CO2: 25 mmol/L (ref 22–32)
Chloride: 87 mmol/L — ABNORMAL LOW (ref 101–111)
Glucose, Bld: 169 mg/dL — ABNORMAL HIGH (ref 65–99)
Potassium: 3.5 mmol/L (ref 3.5–5.1)
Sodium: 124 mmol/L — ABNORMAL LOW (ref 135–145)
TOTAL PROTEIN: 6.6 g/dL (ref 6.5–8.1)

## 2015-10-01 LAB — I-STAT CG4 LACTIC ACID, ED: LACTIC ACID, VENOUS: 0.96 mmol/L (ref 0.5–2.0)

## 2015-10-01 MED ORDER — ENALAPRIL MALEATE 20 MG PO TABS: 20.0000 mg | ORAL_TABLET | Freq: Every day | ORAL | Status: DC

## 2015-10-01 MED ORDER — ACETAMINOPHEN 650 MG RE SUPP: 650.0000 mg | Freq: Four times a day (QID) | RECTAL | Status: DC | PRN

## 2015-10-01 MED ORDER — ACETAMINOPHEN 325 MG PO TABS: 650.0000 mg | ORAL_TABLET | Freq: Four times a day (QID) | ORAL | Status: DC | PRN

## 2015-10-01 MED ORDER — ONDANSETRON HCL 4 MG PO TABS: 4.0000 mg | ORAL_TABLET | Freq: Four times a day (QID) | ORAL | Status: DC | PRN

## 2015-10-01 MED ORDER — SODIUM CHLORIDE 0.9 % IV SOLN
Freq: Once | INTRAVENOUS | Status: AC
  Administered 2015-10-01: via INTRAVENOUS

## 2015-10-01 MED ORDER — ALBUTEROL SULFATE (2.5 MG/3ML) 0.083% IN NEBU: 2.5000 mg | INHALATION_SOLUTION | Freq: Four times a day (QID) | RESPIRATORY_TRACT | Status: DC | PRN

## 2015-10-01 MED ORDER — SODIUM CHLORIDE 0.9 % IV SOLN
Freq: Once | INTRAVENOUS | Status: AC
  Administered 2015-10-02: 04:00:00 via INTRAVENOUS

## 2015-10-01 MED ORDER — MORPHINE SULFATE (PF) 2 MG/ML IV SOLN: 2.0000 mg | INTRAVENOUS | Status: DC | PRN

## 2015-10-01 MED ORDER — SODIUM CHLORIDE 0.9% FLUSH
3.0000 mL | Freq: Two times a day (BID) | INTRAVENOUS | Status: DC
Start: 2015-10-02 — End: 2015-10-03
  Administered 2015-10-02: 3 mL via INTRAVENOUS

## 2015-10-01 MED ORDER — DM-GUAIFENESIN ER 30-600 MG PO TB12
1.0000 | ORAL_TABLET | Freq: Two times a day (BID) | ORAL | Status: DC
  Administered 2015-10-02 – 2015-10-03 (×3): 1 via ORAL
  Filled 2015-10-01 (×5): qty 1

## 2015-10-01 MED ORDER — ONDANSETRON HCL 4 MG/2ML IJ SOLN: 4.0000 mg | Freq: Four times a day (QID) | INTRAMUSCULAR | Status: DC | PRN

## 2015-10-01 NOTE — ED Provider Notes (Signed)
CSN: [id number]     Arrival date & time 10/01/15  2134 History   None    Chief Complaint  Patient presents with  . Cancer   History is obtained from patient via professional interpreter, patient speaks no [place]. History also obtained from patient's daughter via telephone and from Dr. Benson Norway via telephone  (Consider location/radiation/quality/duration/timing/severity/associated sxs/prior Treatment) HPI Patient had outpatient blood cultures drawn yesterday which showed gram-positive in chains and pairs. Second blood culture bottle was negative. Dr. Matthew Saras is concerned as patient has had increased jaundice and elevated bilirubin and hyponatremia. He is concerned about malpositioning of pancreatic stent and plans on attempting to reposition stent tomorrow. Patient will require IV hydration and electrolyte correction prior to procedure. Patient admits to diminished appetite diffuse itching. She denies fever denies other complaint noted no treatment prior to coming here Past Medical History  Diagnosis Date  . HTN (hypertension)   . Hyperlipidemia, mixed   . Yellow skin for last week  . Pancreatic cancer [facility])    Past Surgical History  Procedure Laterality Date  . No past surgeries    . Dental implant surgery    . Ercp N/A 09/13/2015    Procedure: ENDOSCOPIC RETROGRADE CHOLANGIOPANCREATOGRAPHY (ERCP);  Surgeon: Carol Ada, MD;  Location: [facility];  Service: Endoscopy;  Laterality: N/A;  . Eus N/A 09/19/2015    Procedure: ESOPHAGEAL ENDOSCOPIC ULTRASOUND (EUS) RADIAL;  Surgeon: Carol Ada, MD;  Location: [facility];  Service: Endoscopy;  Laterality: N/A;  . Fine needle aspiration N/A 09/19/2015    Procedure: FINE NEEDLE ASPIRATION (FNA) LINEAR;  Surgeon: Carol Ada, MD;  Location: [facility]OS[facility];  Service: Endoscopy;  Laterality: N/A;   Family History  Problem Relation Age of Onset  . Hypertension Mother    Social History  Substance Use Topics  . Smoking status: Never Smoker   .  Smokeless tobacco: Never Used  . Alcohol Use: No   OB History    No data available     Review of Systems  Constitutional: Positive for appetite change.  HENT: Negative.   Respiratory: Negative.   Cardiovascular: Negative.   Gastrointestinal: Negative.   Musculoskeletal: Negative.   Skin: Negative.        Pruritus  Allergic/Immunologic: Positive for immunocompromised state.       Cancer  Neurological: Negative.   Psychiatric/Behavioral: Negative.   All other systems reviewed and are negative.     Allergies  Review of patient's allergies indicates no known allergies.  Home Medications   Prior to Admission medications   Medication Sig Start Date End Date Taking? Authorizing Provider  enalapril (VASOTEC) 20 MG tablet TAKE ONE TABLET BY MOUTH EVERY DAY 11/23/11  Yes Chelle Jeffery, PA-C  hydrochlorothiazide (HYDRODIURIL) 25 MG tablet TAKE ONE TABLET BY MOUTH EVERY DAY WITH  ENALAPRIL  TO  LOWER  BLOOD  PRESSURE 11/23/11  Yes Chelle Jeffery, PA-C  pravastatin (PRAVACHOL) 20 MG tablet TAKE ONE TABLET BY MOUTH EVERY DAY Patient not taking: Reported on 10/01/2015 11/23/11   Chelle Jeffery, PA-C   BP 112/90 mmHg  Pulse 100  Temp(Src) 97.7 F (36.5 C) (Oral)  Resp 20  SpO2 92% Physical Exam  Constitutional: She appears well-developed and well-nourished.  HENT:  Head: Normocephalic and atraumatic.  Mucous membranes dry, icteric  Eyes: Conjunctivae are normal. Pupils are equal, round, and reactive to light. Scleral icterus is present.  Neck: Neck supple. No tracheal deviation present. No thyromegaly present.  Cardiovascular: Normal rate and regular rhythm.   No murmur heard.  Pulmonary/Chest: Effort normal and breath sounds normal.  Abdominal: Soft. Bowel sounds are normal. She exhibits no distension. There is no tenderness.  Musculoskeletal: Normal range of motion. She exhibits no edema or tenderness.  Neurological: She is alert. Coordination normal.  Skin: Skin is warm and  dry. No rash noted.  Jaundice  Psychiatric: She has a normal mood and affect.  Nursing note and vitals reviewed.   ED Course  Procedures (including critical care time) Labs Review Labs Reviewed  CULTURE, BLOOD (ROUTINE X 2)  CULTURE, BLOOD (ROUTINE X 2)  URINE CULTURE  COMPREHENSIVE METABOLIC PANEL  URINALYSIS, ROUTINE W REFLEX MICROSCOPIC (NOT AT [facility])  I-STAT CG4 LACTIC ACID, ED    Imaging Review Dg Chest 2 View  09/30/2015  CLINICAL DATA:  Initial encounter. 70 y/o female with c/o chest discomfort Sat/Sun, dry cough x2 weeks. Recently diagnosed with pancreatic CA, hx HTN - on meds, non-smoker EXAM: CHEST - 2 VIEW COMPARISON:  CT 09/10/2015 FINDINGS: Lungs are clear. Heart size and mediastinal contours are within normal limits. No effusion. Visualized skeletal structures are unremarkable. IMPRESSION: No acute cardiopulmonary disease. Electronically Signed   By: Lucrezia Europe M.D.   On: 09/30/2015 18:26   I have personally reviewed and evaluated these images and lab results as part of my medical decision-making.   EKG Interpretation None      MDM   Results for orders placed or performed during the hospital encounter of 10/01/15  CBC  Result Value Ref Range   WBC 8.2 4.0 - 10.5 K/uL   RBC 3.27 (L) 3.87 - 5.11 MIL/uL   Hemoglobin 9.8 (L) 12.0 - 15.0 g/dL   HCT 27.7 (L) 36.0 - 46.0 %   MCV 84.7 78.0 - 100.0 fL   MCH 30.0 26.0 - 34.0 pg   MCHC 35.4 30.0 - 36.0 g/dL   RDW 15.0 11.5 - 15.5 %   Platelets 229 150 - 400 K/uL  I-Stat CG4 Lactic Acid, ED  (not at [facility])  Result Value Ref Range   Lactic Acid, Venous 0.96 0.5 - 2.0 mmol/L   Dg Chest 2 View  09/30/2015  CLINICAL DATA:  Initial encounter. 70 y/o female with c/o chest discomfort Sat/Sun, dry cough x2 weeks. Recently diagnosed with pancreatic CA, hx HTN - on meds, non-smoker EXAM: CHEST - 2 VIEW COMPARISON:  CT 09/10/2015 FINDINGS: Lungs are clear. Heart size and mediastinal contours are within normal limits. No effusion.  Visualized skeletal structures are unremarkable. IMPRESSION: No acute cardiopulmonary disease. Electronically Signed   By: Lucrezia Europe M.D.   On: 09/30/2015 18:26   Ct Abdomen Pelvis W Contrast  09/10/2015  CLINICAL DATA:  Painless jaundice. Elevated liver function tests. Symptoms for 4 days. EXAM: CT ABDOMEN AND PELVIS WITH CONTRAST TECHNIQUE: Multidetector CT imaging of the abdomen and pelvis was performed using the standard protocol following bolus administration of intravenous contrast. CONTRAST:  145mL ISOVUE-300 IOPAMIDOL (ISOVUE-300) INJECTION 61% COMPARISON:  None. FINDINGS: Lower chest: Minimal motion degradation at the lung bases. Volume loss in the lingula. Heart size upper normal, without pericardial or pleural effusion. Hepatobiliary: No focal liver lesion. Gallbladder distention, without stone or pericholecystic edema. Moderate to marked intrahepatic biliary duct dilatation. The common duct is moderately dilated, including at 1.7 cm in the porta hepatis. This continues to the level of the pancreatic head process detailed below. No evidence of choledocholithiasis. Pancreas: Pancreatic duct dilatation is moderate. Both the pancreatic and common duct dilatation continue to the level of a subtle area of hypoattenuation  within the pancreatic head. This may extend laterally to include the adjacent duodenum. Measures on the order of 2.0 x 2.4 cm on image 30/series 2. Spleen: Normal in size, without focal abnormality. Adrenals/Urinary Tract: Mild left adrenal nodularity. Normal right adrenal gland. Normal kidneys, without hydronephrosis. Normal urinary bladder. Stomach/Bowel: Normal stomach, without wall thickening. Minimal motion degradation continuing into the abdomen and pelvis. Normal terminal ileum. Otherwise normal small bowel. Vascular/Lymphatic: Normal aortic caliber. The celiac is uninvolved on image 19/ series 2. The superior mesenteric artery is free of tumor on image 23/series 2. No venous  involvement with tumor. No retroperitoneal/ peripancreatic adenopathy. No pelvic sidewall adenopathy. Reproductive: Normal uterus and adnexa. Other: No significant free fluid. No evidence of omental or peritoneal disease. Musculoskeletal: Degenerative disc disease at L4-5 and L3-4. Disc bulge at these levels as well as at L2-3 IMPRESSION: 1. Mildly motion degraded exam throughout. 2. Findings most consistent with a subtle adenocarcinoma involving the pancreatic head. Resultant biliary and pancreatic duct dilatation. 3. No findings to preclude surgical resection. If the patient is a surgical candidate clinically, pre and post contrast abdominal MRI may be informative to exclude otherwise occult liver metastasis. Electronically Signed   By: Abigail Miyamoto M.D.   On: 09/10/2015 13:17   Dg Ercp Biliary & Pancreatic Ducts  09/13/2015  CLINICAL DATA:  Biliary obstruction and pancreatic mass. EXAM: ERCP TECHNIQUE: Multiple spot images obtained with the fluoroscopic device and submitted for interpretation post-procedure. COMPARISON:  CT of the abdomen and pelvis on 09/10/2015 FINDINGS: Imaging obtained with a C-arm during ERCP demonstrates wire placement into the pancreatic duct. Cannulation of the common bile duct was then performed which appears dilated. 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 An endoscopic biliary stent was placed spanning across the stricture. 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 These images were submitted for radiologic interpretation only. Please see the procedural report for the amount of contrast and the fluoroscopy time utilized. Electronically Signed   By: Aletta Edouard M.D.   On: 09/13/2015 11:37   Results for orders placed or performed during the hospital encounter of 10/01/15  Comprehensive metabolic panel  Result Value Ref Range   Sodium 124 (L) 135 - 145 mmol/L   Potassium 3.5 3.5 - 5.1 mmol/L   Chloride 87 (L) 101 - 111 mmol/L    CO2 25 22 - 32 mmol/L   Glucose, Bld 169 (H) 65 - 99 mg/dL   BUN 17 6 - 20 mg/dL   Creatinine, Ser <0.30 (L) 0.44 - 1.00 mg/dL   Calcium 8.6 (L) 8.9 - 10.3 mg/dL   Total Protein 6.6 6.5 - 8.1 g/dL   Albumin 2.9 (L) 3.5 - 5.0 g/dL   AST 53 (H) 15 - 41 U/L   ALT 104 (H) 14 - 54 U/L   Alkaline Phosphatase 502 (H) 38 - 126 U/L   Total Bilirubin 11.8 (H) 0.3 - 1.2 mg/dL   GFR calc non Af Amer NOT CALCULATED >60 mL/min   GFR calc Af Amer NOT CALCULATED >60 mL/min   Anion gap 12 5 - 15  CBC  Result Value Ref Range   WBC 8.2 4.0 - 10.5 K/uL   RBC 3.27 (L) 3.87 - 5.11 MIL/uL   Hemoglobin 9.8 (L) 12.0 - 15.0 g/dL   HCT 27.7 (L) 36.0 - 46.0 %   MCV 84.7 78.0 - 100.0 fL   MCH 30.0 26.0 - 34.0 pg   MCHC 35.4 30.0 - 36.0 g/dL  RDW 15.0 11.5 - 15.5 %   Platelets 229 150 - 400 K/uL  I-Stat CG4 Lactic Acid, ED  (not at [facility])  Result Value Ref Range   Lactic Acid, Venous 0.96 0.5 - 2.0 mmol/L   Dg Chest 2 View  09/30/2015  CLINICAL DATA:  Initial encounter. 70 y/o female with c/o chest discomfort Sat/Sun, dry cough x2 weeks. Recently diagnosed with pancreatic CA, hx HTN - on meds, non-smoker EXAM: CHEST - 2 VIEW COMPARISON:  CT 09/10/2015 FINDINGS: Lungs are clear. Heart size and mediastinal contours are within normal limits. No effusion. Visualized skeletal structures are unremarkable. IMPRESSION: No acute cardiopulmonary disease. Electronically Signed   By: Lucrezia Europe M.D.   On: 09/30/2015 18:26   Ct Abdomen Pelvis W Contrast  09/10/2015  CLINICAL DATA:  Painless jaundice. Elevated liver function tests. Symptoms for 4 days. EXAM: CT ABDOMEN AND PELVIS WITH CONTRAST TECHNIQUE: Multidetector CT imaging of the abdomen and pelvis was performed using the standard protocol following bolus administration of intravenous contrast. CONTRAST:  134mL ISOVUE-300 IOPAMIDOL (ISOVUE-300) INJECTION 61% COMPARISON:  None. FINDINGS: Lower chest: Minimal motion degradation at the lung bases. Volume loss in the  lingula. Heart size upper normal, without pericardial or pleural effusion. Hepatobiliary: No focal liver lesion. Gallbladder distention, without stone or pericholecystic edema. Moderate to marked intrahepatic biliary duct dilatation. The common duct is moderately dilated, including at 1.7 cm in the porta hepatis. This continues to the level of the pancreatic head process detailed below. No evidence of choledocholithiasis. Pancreas: Pancreatic duct dilatation is moderate. Both the pancreatic and common duct dilatation continue to the level of a subtle area of hypoattenuation within the pancreatic head. This may extend laterally to include the adjacent duodenum. Measures on the order of 2.0 x 2.4 cm on image 30/series 2. Spleen: Normal in size, without focal abnormality. Adrenals/Urinary Tract: Mild left adrenal nodularity. Normal right adrenal gland. Normal kidneys, without hydronephrosis. Normal urinary bladder. Stomach/Bowel: Normal stomach, without wall thickening. Minimal motion degradation continuing into the abdomen and pelvis. Normal terminal ileum. Otherwise normal small bowel. Vascular/Lymphatic: Normal aortic caliber. The celiac is uninvolved on image 19/ series 2. The superior mesenteric artery is free of tumor on image 23/series 2. No venous involvement with tumor. No retroperitoneal/ peripancreatic adenopathy. No pelvic sidewall adenopathy. Reproductive: Normal uterus and adnexa. Other: No significant free fluid. No evidence of omental or peritoneal disease. Musculoskeletal: Degenerative disc disease at L4-5 and L3-4. Disc bulge at these levels as well as at L2-3 IMPRESSION: 1. Mildly motion degraded exam throughout. 2. Findings most consistent with a subtle adenocarcinoma involving the pancreatic head. Resultant biliary and pancreatic duct dilatation. 3. No findings to preclude surgical resection. If the patient is a surgical candidate clinically, pre and post contrast abdominal MRI may be informative  to exclude otherwise occult liver metastasis. Electronically Signed   By: Abigail Miyamoto M.D.   On: 09/10/2015 13:17   Dg Ercp Biliary & Pancreatic Ducts  09/13/2015  CLINICAL DATA:  Biliary obstruction and pancreatic mass. EXAM: ERCP TECHNIQUE: Multiple spot images obtained with the fluoroscopic device and submitted for interpretation post-procedure. COMPARISON:  CT of the abdomen and pelvis on 09/10/2015 FINDINGS: Imaging obtained with a C-arm during ERCP demonstrates wire placement into the pancreatic duct. Cannulation of the common bile duct was then performed which appears dilated. 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 An endoscopic biliary stent was placed spanning across the stricture. 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 These images  were submitted for radiologic interpretation only. Please see the procedural report for the amount of contrast and the fluoroscopy time utilized. Electronically Signed   By: Aletta Edouard M.D.   On: 09/13/2015 11:37     Final diagnoses:  None   Dr.Hung will consult on case and see patient in hospital tomorrow. I consulted Dr.Niu will see patient in hospital. Plan admit telemetry. Nothing by mouth. IV hydration. Positive Blood cultures in one of 2 bottles likely contaminant with no fever and no leukocytosis Diagnosis #1 hyperbilirubinemia #2 hyponatremia #3 elevated LFTs #5 anemia #4 dehydration #4 hyperglycemia     Orlie Dakin, MD 10/01/15 2355

## 2015-10-01 NOTE — ED Notes (Signed)
Bed: [id number] Expected date:  Expected time:  Means of arrival:  Comments: HOLD FOR TRIAGE 1

## 2015-10-01 NOTE — ED Notes (Addendum)
Pt comes in after leaving before being seen yesterday for cough. Pt is jaundiced appearing and has a recent dx of Pancreatic CA. Per notes and daughter, pt had positive blood cultures yesterday and needed to come back in for evaluation. Dr Benson Norway seems to think pt may be septic from a stent that was placed in her pancreas on 2/3. Alert and oriented.

## 2015-10-01 NOTE — ED Notes (Signed)
Contacted lab to add on CBC.

## 2015-10-01 NOTE — H&P (Addendum)
[facility] History and Physical  Kristin Valentine [id number] DOB: 22-Aug-1945 DOA: 10/01/2015  Referring physician: ED physician PCP: No PCP Per Patient  Specialists:   Chief Complaint:  Cough, jaundice and positive blood culture 1/2  HPI: Kristin Valentine is a 70 y.o. female with PMH of pancreatic cancer diagnosed on 09/19/15, hypertension, hyperlipidemia, obstructive jaundice, S/p of ERCP and stent placement, who presents with cough, jaundice and positive blood culture 1/2.   Patient was recently diagnosed as pancreatic adenocarcinoma on 09/19/15. She underwent ERCP and stent placement for obstructive jaundice by Dr. Benson Norway on 09/13/15. Patient reports that she has been having cough for almost 2 weeks, with small amount of white mucus production. Patient does not have chest pain, shortness of breath, runny nose or sore throat. She had chills on Saturday, but currently no fever or chills. Patient had blood culture drawn on 2/20, which showed gram-positive cocci in chains and in pairs in one of the two bottles. Pt was advised to come back to hospital for further evaluation. Patient does not have abdominal pain, diarrhea, symptoms of UTI or unilateral weakness.   In ED, patient was found to have normal lactic acid 1.08--> 0.96, WBC 8.2, temperature normal, transient tachycardia, tachypnea, sodium 124, renal function okay, negative chest x-ray for acute abnormalities, abnormal liver function with ALP 502, AST 53, ALT 104, total bilirubin 11.8, which was 8.6 on 09/30/15. Patients and admitted to inpatient for further evaluation treatment. GI, Dr. Benson Norway was consulted by EDP.  EKG: Not done in ED, will get one.   Where does patient live?   At home   Can patient participate in ADLs?  Some   Review of Systems:   General: no fevers, chills, no changes in body weight, has poor appetite, has fatigue HEENT: no blurry vision, hearing changes or sore throat Pulm: no dyspnea, has coughing, no wheezing CV: no chest  pain, no palpitations Abd: no nausea, vomiting, abdominal pain, diarrhea, constipation GU: no dysuria, burning on urination, increased urinary frequency, hematuria  Ext: no leg edema Neuro: no unilateral weakness, numbness, or tingling, no vision change or hearing loss Skin: no rash. Has jaundice  MSK: No muscle spasm, no deformity, no limitation of range of movement in spin Heme: No easy bruising.  Travel history: No recent long distant travel.  Allergy: No Known Allergies  Past Medical History  Diagnosis Date  . HTN (hypertension)   . Hyperlipidemia, mixed   . Yellow skin for last week  . Pancreatic cancer [facility])     Past Surgical History  Procedure Laterality Date  . No past surgeries    . Dental implant surgery    . Ercp N/A 09/13/2015    Procedure: ENDOSCOPIC RETROGRADE CHOLANGIOPANCREATOGRAPHY (ERCP);  Surgeon: Carol Ada, MD;  Location: [facility];  Service: Endoscopy;  Laterality: N/A;  . Eus N/A 09/19/2015    Procedure: ESOPHAGEAL ENDOSCOPIC ULTRASOUND (EUS) RADIAL;  Surgeon: Carol Ada, MD;  Location: [facility];  Service: Endoscopy;  Laterality: N/A;  . Fine needle aspiration N/A 09/19/2015    Procedure: FINE NEEDLE ASPIRATION (FNA) LINEAR;  Surgeon: Carol Ada, MD;  Location: [facility];  Service: Endoscopy;  Laterality: N/A;    Social History:  reports that she has never smoked. She has never used smokeless tobacco. She reports that she does not drink alcohol or use illicit drugs.  Family History:  Family History  Problem Relation Age of Onset  . Hypertension Mother      Prior to Admission medications  Medication Sig Start Date End Date Taking? Authorizing Provider  enalapril (VASOTEC) 20 MG tablet TAKE ONE TABLET BY MOUTH EVERY DAY 11/23/11  Yes Chelle Jeffery, PA-C  hydrochlorothiazide (HYDRODIURIL) 25 MG tablet TAKE ONE TABLET BY MOUTH EVERY DAY WITH  ENALAPRIL  TO  LOWER  BLOOD  PRESSURE 11/23/11  Yes Chelle Jeffery, PA-C  pravastatin (PRAVACHOL) 20 MG  tablet TAKE ONE TABLET BY MOUTH EVERY DAY Patient not taking: Reported on 10/01/2015 11/23/11   Harrison Mons, PA-C    Physical Exam: Filed Vitals:   10/01/15 2203  BP: 112/90  Pulse: 100  Temp: 97.7 F (36.5 C)  TempSrc: Oral  Resp: 20  SpO2: 92%   General: Not in acute distress HEENT:       Eyes: PERRL, EOMI       ENT: No discharge from the ears and nose, no pharynx injection, no tonsillar enlargement.        Neck: No JVD, no bruit, no mass felt. Heme: No neck lymph node enlargement. Cardiac: S1/S2, RRR, No murmurs, No gallops or rubs. Pulm:  No rales, wheezing, rhonchi or rubs. Abd: Soft, nondistended, nontender, no rebound pain, no organomegaly, BS present. Ext: No pitting leg edema bilaterally. 2+DP/PT pulse bilaterally. Musculoskeletal: No joint deformities, No joint redness or warmth, no limitation of ROM in spin. Skin: No rashes. Has jaundice Neuro: Alert, oriented X3, cranial nerves II-XII grossly intact, moves all extremities normally.  Psych: Patient is not psychotic, no suicidal or hemocidal ideation.  Labs on Admission:  Basic Metabolic Panel:  Recent Labs Lab 09/30/15 1801 10/01/15 2233  NA 124* 124*  K 3.5 3.5  CL 87* 87*  CO2 24 25  GLUCOSE 173* 169*  BUN 16 17  CREATININE 0.33* <0.30*  CALCIUM 8.8* 8.6*   Liver Function Tests:  Recent Labs Lab 09/30/15 1801 10/01/15 2233  AST 91* 53*  ALT 158* 104*  ALKPHOS 567* 502*  BILITOT 10.6* 11.8*  PROT 7.4 6.6  ALBUMIN 3.3* 2.9*   No results for input(s): LIPASE, AMYLASE in the last 168 hours. No results for input(s): AMMONIA in the last 168 hours. CBC:  Recent Labs Lab 10/01/15 2233  WBC 8.2  HGB 9.8*  HCT 27.7*  MCV 84.7  PLT 229   Cardiac Enzymes: No results for input(s): CKTOTAL, CKMB, CKMBINDEX, TROPONINI in the last 168 hours.  BNP (last 3 results) No results for input(s): BNP in the last 8760 hours.  ProBNP (last 3 results) No results for input(s): PROBNP in the last 8760  hours.  CBG: No results for input(s): GLUCAP in the last 168 hours.  Radiological Exams on Admission: Dg Chest 2 View  09/30/2015  CLINICAL DATA:  Initial encounter. 70 y/o female with c/o chest discomfort Sat/Sun, dry cough x2 weeks. Recently diagnosed with pancreatic CA, hx HTN - on meds, non-smoker EXAM: CHEST - 2 VIEW COMPARISON:  CT 09/10/2015 FINDINGS: Lungs are clear. Heart size and mediastinal contours are within normal limits. No effusion. Visualized skeletal structures are unremarkable. IMPRESSION: No acute cardiopulmonary disease. Electronically Signed   By: Lucrezia Europe M.D.   On: 09/30/2015 18:26    Assessment/Plan Principal Problem:   Positive blood culture Active Problems:   HTN (hypertension)   Hyperlipidemia, mixed   Pancreatic cancer (HCC)   Cough   Hyperbilirubinemia   Hyponatremia  Positive blood culture: pt had positive blood culture with gram-positive cocci in chains and in pairs in anaerobic bottle only. Patient does not have fever and chills. This is likely due  to contamination. Will hold off Abx now and repeat blood culture. Lactic acid is normal. Clinically not septic.   -will admit to tele bed -blood culture x 2 -hold Abx now, but will have low threshold to start antibiotics if patient developed signs of infection.  Cough: Etiology is not clear. Chest x-ray is negative for acute abnormalities. Lungs clear on auscultation. Pt is taking enalapril, which might have contributed to her cough. -Switch enalapril to amlodipine for hypertension -check flu pcr -mucinex for cough  HTN (hypertension): -amlodipine as above -IV hydralazine when necessary  Hyponatremia: Sodium 124. Most likely due to decreased oral intake. Mental status normal. -IV normal saline, 150 mL per hour -Follow-up diabetes type  Pancreatic cancer [facility]): newly diagnosed. S/p of stent placement for obstructive jaundice. -need f/u with oncology -GI on borad  Hyperbilirubinemia and itches:  Due to obstructive jaundice. s/p of stent placement. Total bilirubin increased from 10.6-->11.8. Patient has itches. GI, Dr. Benson Norway was consulted by EDP. -Dr. Benson Norway may adjust her stent tomorrow -keep NPO -prn Benadryl  HLD: Last LDL was 171 on 10/18/10 -Continue home medications: Pravastatin -Check FLP    DVT ppx: SCD  Code Status: Full code Family Communication: None at bed side. Disposition Plan: Admit to inpatient   Date of Service 10/02/2015    Ivor Costa [facility] Pager [phone]  If 7PM-7AM, please contact night-coverage [email or web address] Password [email or web address] 10/02/2015, 12:19 AM

## 2015-10-02 ENCOUNTER — Encounter (HOSPITAL_COMMUNITY): Payer: Self-pay

## 2015-10-02 ENCOUNTER — Encounter (HOSPITAL_COMMUNITY)
Admission: EM | Disposition: A | Discharge status: Home / Self Care | Payer: Self-pay | Source: Home / Self Care | Attending: Internal Medicine

## 2015-10-02 ENCOUNTER — Inpatient Hospital Stay (HOSPITAL_COMMUNITY): Payer: Medicare Other | Admitting: Anesthesiology

## 2015-10-02 ENCOUNTER — Inpatient Hospital Stay (HOSPITAL_COMMUNITY): Payer: Medicare Other

## 2015-10-02 DIAGNOSIS — E871 Hypo-osmolality and hyponatremia: Secondary | ICD-10-CM

## 2015-10-02 HISTORY — PX: ERCP: SHX5425

## 2015-10-02 LAB — LIPID PANEL
CHOL/HDL RATIO: 18.2 ratio
CHOLESTEROL: 182 mg/dL (ref 0–200)
HDL: 10 mg/dL — ABNORMAL LOW (ref >40)
LDL Cholesterol: 134 mg/dL — ABNORMAL HIGH (ref 0–99)
Triglycerides: 189 mg/dL — ABNORMAL HIGH (ref <150)
VLDL: 38 mg/dL (ref 0–40)

## 2015-10-02 LAB — BASIC METABOLIC PANEL
ANION GAP: 9 (ref 5–15)
BUN: 11 mg/dL (ref 6–20)
CALCIUM: 7.8 mg/dL — AB (ref 8.9–10.3)
CO2: 23 mmol/L (ref 22–32)
Chloride: 96 mmol/L — ABNORMAL LOW (ref 101–111)
Creatinine, Ser: <0.3 mg/dL — ABNORMAL LOW (ref 0.44–1.00)
Glucose, Bld: 196 mg/dL — ABNORMAL HIGH (ref 65–99)
POTASSIUM: 3.5 mmol/L (ref 3.5–5.1)
SODIUM: 128 mmol/L — AB (ref 135–145)

## 2015-10-02 LAB — COMPREHENSIVE METABOLIC PANEL
ALK PHOS: 446 U/L — AB (ref 38–126)
ALT: 82 U/L — AB (ref 14–54)
ANION GAP: 9 (ref 5–15)
AST: 43 U/L — ABNORMAL HIGH (ref 15–41)
Albumin: 2.4 g/dL — ABNORMAL LOW (ref 3.5–5.0)
BILIRUBIN TOTAL: 10.5 mg/dL — AB (ref 0.3–1.2)
BUN: 12 mg/dL (ref 6–20)
CALCIUM: 7.8 mg/dL — AB (ref 8.9–10.3)
CO2: 24 mmol/L (ref 22–32)
Chloride: 93 mmol/L — ABNORMAL LOW (ref 101–111)
Glucose, Bld: 137 mg/dL — ABNORMAL HIGH (ref 65–99)
Potassium: 3.1 mmol/L — ABNORMAL LOW (ref 3.5–5.1)
Sodium: 126 mmol/L — ABNORMAL LOW (ref 135–145)
TOTAL PROTEIN: 5.6 g/dL — AB (ref 6.5–8.1)

## 2015-10-02 LAB — APTT: aPTT: 34 seconds (ref 24–37)

## 2015-10-02 LAB — PROTIME-INR
INR: 1.14 (ref 0.00–1.49)
Prothrombin Time: 14.3 seconds (ref 11.6–15.2)

## 2015-10-02 LAB — INFLUENZA PANEL BY PCR (TYPE A & B)
H1N1FLUPCR: NOT DETECTED
INFLAPCR: NEGATIVE
INFLBPCR: NEGATIVE

## 2015-10-02 LAB — CBG MONITORING, ED: GLUCOSE-CAPILLARY: 133 mg/dL — AB (ref 65–99)

## 2015-10-02 SURGERY — ERCP, WITH INTERVENTION IF INDICATED
Anesthesia: Monitor Anesthesia Care

## 2015-10-02 MED ORDER — SUCCINYLCHOLINE CHLORIDE 20 MG/ML IJ SOLN
INTRAMUSCULAR | Status: DC | PRN
  Administered 2015-10-02: 100 mg via INTRAVENOUS

## 2015-10-02 MED ORDER — SODIUM CHLORIDE 0.9 % IV SOLN
INTRAVENOUS | Status: DC
  Administered 2015-10-02: 17:00:00 via INTRAVENOUS

## 2015-10-02 MED ORDER — SODIUM CHLORIDE 0.9 % IV SOLN
INTRAVENOUS | Status: DC | PRN
  Administered 2015-10-02: 20 mL

## 2015-10-02 MED ORDER — LIDOCAINE HCL (CARDIAC) 20 MG/ML IV SOLN
INTRAVENOUS | Status: DC | PRN
  Administered 2015-10-02: 60 mg via INTRATRACHEAL

## 2015-10-02 MED ORDER — DIPHENHYDRAMINE HCL 25 MG PO CAPS
25.0000 mg | ORAL_CAPSULE | Freq: Four times a day (QID) | ORAL | Status: DC | PRN
  Administered 2015-10-03: 25 mg via ORAL
  Filled 2015-10-02: qty 1

## 2015-10-02 MED ORDER — FENTANYL CITRATE (PF) 100 MCG/2ML IJ SOLN: 25.0000 ug | INTRAMUSCULAR | Status: DC | PRN

## 2015-10-02 MED ORDER — POTASSIUM CHLORIDE CRYS ER 20 MEQ PO TBCR
40.0000 meq | EXTENDED_RELEASE_TABLET | Freq: Once | ORAL | Status: AC
  Administered 2015-10-02: 40 meq via ORAL
  Filled 2015-10-02: qty 2

## 2015-10-02 MED ORDER — SODIUM CHLORIDE 0.9 % IV SOLN: INTRAVENOUS | Status: DC

## 2015-10-02 MED ORDER — AMPICILLIN-SULBACTAM SODIUM 1.5 (1-0.5) G IJ SOLR
1.5000 g | Freq: Once | INTRAMUSCULAR | Status: AC
  Administered 2015-10-02: 1.5 g via INTRAVENOUS
  Filled 2015-10-02: qty 1.5

## 2015-10-02 MED ORDER — LIDOCAINE HCL (CARDIAC) 20 MG/ML IV SOLN
INTRAVENOUS | Status: AC
  Filled 2015-10-02: qty 5

## 2015-10-02 MED ORDER — HYDRALAZINE HCL 20 MG/ML IJ SOLN: 5.0000 mg | INTRAMUSCULAR | Status: DC | PRN

## 2015-10-02 MED ORDER — AMLODIPINE BESYLATE 5 MG PO TABS
5.0000 mg | ORAL_TABLET | Freq: Every day | ORAL | Status: DC
  Administered 2015-10-03: 5 mg via ORAL
  Filled 2015-10-02 (×3): qty 1

## 2015-10-02 MED ORDER — PROPOFOL 10 MG/ML IV BOLUS
INTRAVENOUS | Status: DC | PRN
  Administered 2015-10-02: 120 mg via INTRAVENOUS

## 2015-10-02 MED ORDER — SODIUM CHLORIDE 0.9 % IV SOLN
INTRAVENOUS | Status: DC
  Administered 2015-10-02: 1000 mL via INTRAVENOUS

## 2015-10-02 NOTE — Progress Notes (Signed)
PT Cancellation Note  Patient Details Name: COROLYN HASHBARGER MRN: [id number] DOB: Feb 01, 1946   Cancelled Treatment:    Reason Eval/Treat Not Completed: Medical issues which prohibited therapy.  Noted PT order, tried to see pt in ED, however pt at procedure and just returning to unit from procedure at this time. ERCP with stent placement(ERCP with stent placement). Will check on pt tomorrow.    Clide Dales 10/02/2015, 3:43 PM

## 2015-10-02 NOTE — Transfer of Care (Signed)
Immediate Anesthesia Transfer of Care Note  Patient: Kristin Valentine  Procedure(s) Performed: Procedure(s) with comments: ENDOSCOPIC RETROGRADE CHOLANGIOPANCREATOGRAPHY (ERCP) (N/A) - Stent change.  Patient Location: PACU and Endoscopy Unit  Anesthesia Type:General  Level of Consciousness: awake, alert  and patient cooperative  Airway & Oxygen Therapy: Patient Spontanous Breathing and Patient connected to face mask oxygen  Post-op Assessment: Report given to RN and Post -op Vital signs reviewed and stable  Post vital signs: Reviewed and stable  Last Vitals:  Filed Vitals:   10/02/15 1211 10/02/15 1332  BP: 143/85 143/76  Pulse: 88 99  Temp: 37 C   Resp: 24 24    Complications: No apparent anesthesia complications

## 2015-10-02 NOTE — Consult Note (Signed)
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  She initially presented to the office with painless jaundice and the blood work confirmed an obstructive pattern.  An ERCP was performed on 09/13/2015 and it was successful in draining the biliary tract.  An 8.5 Fr x 5 cm stent was placed achieving excellent drainage.  She followed up on 09/19/2015 for an EUS with FNA, but one day prior her jaundice and pruritis recurred.  Subsequently she was evaluated at [facility] for possible resection and she is undergoing staging, however, her jaundice persisted and worsened.  She was in the ER two days before for a cough, which started after the the stent placement.  Cipro was started on 09/19/2015 for the possibility of a mild aspiration pneumonia at the time of the stent placement.  1/2 blood cultures revealed gram positive cocci in pairs and chains.  No reports of fever and clinically she was hemodynamically stable.  Her TB increased up to 10 and she is moderately hyponatremic.   Past Medical History  Diagnosis Date  . HTN (hypertension)   . Hyperlipidemia, mixed   . Yellow skin for last week  . Pancreatic cancer [facility])     Past Surgical History  Procedure Laterality Date  . No past surgeries    . Dental implant surgery    . Ercp N/A 09/13/2015    Procedure: ENDOSCOPIC RETROGRADE CHOLANGIOPANCREATOGRAPHY (ERCP);  Surgeon: Carol Ada, MD;  Location: [facility];  Service: Endoscopy;  Laterality: N/A;  . Eus N/A 09/19/2015    Procedure: ESOPHAGEAL ENDOSCOPIC ULTRASOUND (EUS) RADIAL;  Surgeon: Carol Ada, MD;  Location: [facility];  Service: Endoscopy;  Laterality: N/A;  . Fine needle aspiration N/A 09/19/2015    Procedure: FINE NEEDLE ASPIRATION (FNA) LINEAR;  Surgeon: Carol Ada, MD;  Location: [facility];  Service: Endoscopy;  Laterality: N/A;     Family History  Problem Relation Age of Onset  . Hypertension Mother     Social History:  reports that she has never smoked. She has never used smokeless tobacco. She reports that she does not drink alcohol or use illicit drugs.  Allergies: No Known Allergies  Medications:  Scheduled: . amLODipine  5 mg Oral Daily  . dextromethorphan-guaiFENesin  1 tablet Oral BID  . sodium chloride flush  3 mL Intravenous Q12H   Continuous:   Results for orders placed or performed during the hospital encounter of 10/01/15 (from the past 24 hour(s))  Comprehensive metabolic panel     Status: Abnormal   Collection Time: 10/01/15 10:33 PM  Result Value Ref Range   Sodium 124 (L) 135 - 145 mmol/L   Potassium 3.5 3.5 - 5.1 mmol/L   Chloride 87 (L) 101 - 111 mmol/L   CO2 25 22 - 32 mmol/L   Glucose, Bld 169 (H) 65 - 99 mg/dL   BUN 17 6 - 20 mg/dL   Creatinine, Ser <0.30 (L) 0.44 - 1.00 mg/dL   Calcium 8.6 (L) 8.9 - 10.3 mg/dL   Total Protein 6.6 6.5 - 8.1 g/dL   Albumin 2.9 (L) 3.5 - 5.0 g/dL   AST 53 (H) 15 - 41 U/L   ALT 104 (H) 14 - 54 U/L   Alkaline Phosphatase 502 (H) 38 - 126 U/L   Total Bilirubin 11.8 (H) 0.3 - 1.2 mg/dL   GFR calc non Af Wyvonnia Lora  NOT CALCULATED >60 mL/min   GFR calc Af Amer NOT CALCULATED >60 mL/min   Anion gap 12 5 - 15  CBC     Status: Abnormal   Collection Time: 10/01/15 10:33 PM  Result Value Ref Range   WBC 8.2 4.0 - 10.5 K/uL   RBC 3.27 (L) 3.87 - 5.11 MIL/uL   Hemoglobin 9.8 (L) 12.0 - 15.0 g/dL   HCT 27.7 (L) 36.0 - 46.0 %   MCV 84.7 78.0 - 100.0 fL   MCH 30.0 26.0 - 34.0 pg   MCHC 35.4 30.0 - 36.0 g/dL   RDW 15.0 11.5 - 15.5 %   Platelets 229 150 - 400 K/uL  Protime-INR     Status: None   Collection Time: 10/01/15 10:41 PM  Result Value Ref Range   Prothrombin Time 14.3 11.6 - 15.2 seconds   INR 1.14 0.00 - 1.49  APTT     Status: None   Collection Time: 10/01/15 10:41 PM  Result Value Ref Range   aPTT 34 24 - 37 seconds  I-Stat CG4 Lactic Acid,  ED  (not at [facility])     Status: None   Collection Time: 10/01/15 10:44 PM  Result Value Ref Range   Lactic Acid, Venous 0.96 0.5 - 2.0 mmol/L  Blood culture (routine x 2)     Status: None (Preliminary result)   Collection Time: 10/02/15 12:59 AM  Result Value Ref Range   Specimen Description BLOOD LEFT ANTECUBITAL    Special Requests BOTTLES DRAWN AEROBIC AND ANAEROBIC 5CC    Culture PENDING    Report Status PENDING   Comprehensive metabolic panel     Status: Abnormal   Collection Time: 10/02/15  5:42 AM  Result Value Ref Range   Sodium 126 (L) 135 - 145 mmol/L   Potassium 3.1 (L) 3.5 - 5.1 mmol/L   Chloride 93 (L) 101 - 111 mmol/L   CO2 24 22 - 32 mmol/L   Glucose, Bld 137 (H) 65 - 99 mg/dL   BUN 12 6 - 20 mg/dL   Creatinine, Ser <0.30 (L) 0.44 - 1.00 mg/dL   Calcium 7.8 (L) 8.9 - 10.3 mg/dL   Total Protein 5.6 (L) 6.5 - 8.1 g/dL   Albumin 2.4 (L) 3.5 - 5.0 g/dL   AST 43 (H) 15 - 41 U/L   ALT 82 (H) 14 - 54 U/L   Alkaline Phosphatase 446 (H) 38 - 126 U/L   Total Bilirubin 10.5 (H) 0.3 - 1.2 mg/dL   GFR calc non Af Amer NOT CALCULATED >60 mL/min   GFR calc Af Amer NOT CALCULATED >60 mL/min   Anion gap 9 5 - 15     Dg Chest 2 View  09/30/2015  CLINICAL DATA:  Initial encounter. 70 y/o female with c/o chest discomfort Sat/Sun, dry cough x2 weeks. Recently diagnosed with pancreatic CA, hx HTN - on meds, non-smoker EXAM: CHEST - 2 VIEW COMPARISON:  CT 09/10/2015 FINDINGS: Lungs are clear. Heart size and mediastinal contours are within normal limits. No effusion. Visualized skeletal structures are unremarkable. IMPRESSION: No acute cardiopulmonary disease. Electronically Signed   By: Lucrezia Europe M.D.   On: 09/30/2015 18:26    ROS:  As stated above in the HPI otherwise negative.  Blood pressure 109/80, pulse 85, temperature 97.9 F (36.6 C), temperature source Oral, resp. rate 18, SpO2 96 %.    PE: Gen: NAD, Alert and Oriented HEENT:  Morris/AT, EOMI Neck: Supple, no LAD Lungs: CTA  Bilaterally CV: RRR  without M/G/R ABM: Soft, NTND, +BS Ext: No C/C/E  Assessment/Plan: 1) Recurrent jaundice. 2) Pancreatic head mass. 3) ? Blood culture contaminant.   I will repeat the ERCP and insert a new stent.  I do not know if the stent has slipped, as suggested during the EUS, but was repositioned at that time.  I discussed the case with Dr. Carlis Abbott from [facility] and he is fine with having the patient undergo a covered metallic stent placement.  Hopefully this will keep the duct patent and the patient symptom free.  Plan: 1) ERCP with stent exchange.  Mashelle Busick D 10/02/2015, 7:02 AM

## 2015-10-02 NOTE — ED Notes (Signed)
Spoke to Dr Wynelle Cleveland regarding pt's scheduled meds. Pt will have endoscopy today, pt is on strict NPO/ all Po meds has been held per Dr, Wynelle Cleveland verbal order.

## 2015-10-02 NOTE — Progress Notes (Signed)
CRITICAL VALUE ALERT  Critical value received:  Blood Cultures both bottles gram positive cocci and chains  Date of notification:  10/02/15  Time of notification:  [id number]  Critical value read back: yes  Nurse who received alert:  Felicity Coyer  MD notified (1st page):  Rizwan  Time of first page:  1736  MD notified (2nd page):  Time of second page:  Responding MD:    Time MD responded:

## 2015-10-02 NOTE — Anesthesia Preprocedure Evaluation (Addendum)
Anesthesia Evaluation  Patient identified by MRN, date of birth, ID band Patient awake    Reviewed: Allergy & Precautions, H&P , NPO status , Patient's Chart, lab work & pertinent test results, reviewed documented beta blocker date and time   Airway Mallampati: II  TM Distance: >3 FB Neck ROM: Full    Dental no notable dental hx. (+) Dental Advisory Given, Teeth Intact   Pulmonary neg pulmonary ROS,    Pulmonary exam normal breath sounds clear to auscultation       Cardiovascular hypertension, Pt. on medications Normal cardiovascular exam Rhythm:Regular Rate:Normal     Neuro/Psych negative neurological ROS  negative psych ROS   GI/Hepatic negative GI ROS, Neg liver ROS,   Endo/Other  negative endocrine ROSPancreatic mass  Renal/GU negative Renal ROS  negative genitourinary   Musculoskeletal negative musculoskeletal ROS (+)   Abdominal (+)  Abdomen: soft.    Peds negative pediatric ROS (+)  Hematology negative hematology ROS (+) anemia ,   Anesthesia Other Findings Interpreter present... Niece Low Na+ Coughing; No production. Chest clear  Reproductive/Obstetrics negative OB ROS                           Anesthesia Physical  Anesthesia Plan  ASA: II  Anesthesia Plan: MAC and General   Post-op Pain Management:    Induction: Intravenous  Airway Management Planned: Oral ETT  Additional Equipment:   Intra-op Plan:   Post-operative Plan: Extubation in OR  Informed Consent: I have reviewed the patients History and Physical, chart, labs and discussed the procedure including the risks, benefits and alternatives for the proposed anesthesia with the patient or authorized representative who has indicated his/her understanding and acceptance.   Dental Advisory Given  Plan Discussed with: CRNA and Surgeon  Anesthesia Plan Comments: (Discussed sedation and potential to need to place  airway or ETT if warranted by clinical changes intra-operatively. We will start procedure as MAC.)      Anesthesia Quick Evaluation

## 2015-10-02 NOTE — Op Note (Signed)
[facility] [place], [address]   ERCP PROCEDURE REPORT        EXAM DATE: 10/02/2015  PATIENT NAME:          Kristin Valentine, Kristin Valentine          MR #:        [id number]  BIRTHDATE:       Jul 08, 1946     VISIT #:     [phone] ATTENDING:     Carol Ada, MD     STATUS:     outpatient ASSISTANT:      Elspeth Cho, Jobe Igo, and [place], [place]  INDICATIONS:  The patient is a 70 yr old female here for an ERCP due to stent occlusion. PROCEDURE PERFORMED:     ERCP with stent placement MEDICATIONS:     General Anesthesia and Unasyn 1.5 grams IV   CONSENT: The patient understands the risks and benefits of the procedure and understands that these risks include, but are not limited to: sedation, allergic reaction, infection, perforation and/or bleeding. Alternative means of evaluation and treatment include, among others: physical exam, x-rays, and/or surgical intervention. The patient elects to proceed with this endoscopic procedure.  DESCRIPTION OF PROCEDURE: During intra-op preparation period all mechanical & medical equipment was checked for proper function. Hand hygiene and appropriate measures for infection prevention was taken. After the risks, benefits and alternatives of the procedure were thoroughly explained, Informed was verified, confirmed and timeout was successfully executed by the treatment team. With the patient in left semi-prone position, medications were administered intravenously.The    was passed from the mouth into the esophagus and further advanced from the esophagus into the stomach. From stomach scope was directed to the second portion of the duodenum. Major papilla was aligned with the duodenoscope. The scope position was confirmed fluoroscopically. Rest of the findings/therapeutics are given below. The scope was then completely withdrawn from the patient and the procedure completed. The pulse, BP, and O2 saturation  were monitored and documented by the physician and the nursing staff throughout the entire procedure. The patient was cared for as planned according to standard protocol. The patient was then discharged to recovery in stable condition and with appropriate post procedure care. Estimated blood loss is zero unless otherwise noted in this procedure report.  The ampulla was located the second portion of the duodenum.  The plastic biliary stent was visualized and in excellent position. The reason for the stent occlusion was secondary to mucus.  The stent was completely clogged with mucus.  The guidewire was advanced along side of the stent and secured in the distal right hepatic duct.  Contrast injection revealed a dilated proximal CBD and no drainage distally.  The stent was removed with a snare. Using the vertebrae as a landmark for the proximal extent of the prior stent a 10 mm x 60 mm covered metallic stent was successfully deployed under fluoroscopy.  With deployment more mucus drained. Gross and fluoroscopic imaged confirmed excellent placement of the stent.  The procedure was then terminated.  No cannulation of the PD occurred.    ADVERSE EVENT:     There were no complications. IMPRESSIONS:     Mucus plugging of the plastic biliary stent with successful placement of a 10 mm x 60 mm covered metallic stent.  RECOMMENDATIONS:     1) Follow up with staging tomorrow at [facility]. 2) Discharged with Augmentin 875 mg BID x 7 days to prophylax against bacteremia.  REPEAT  EXAM:  ___________________________________ Carol Ada, MD eSigned:  Carol Ada, MD 10/12/15 1:31 PM  cc:  CPT CODES: ICD9 CODES:

## 2015-10-02 NOTE — Anesthesia Procedure Notes (Signed)
Procedure Name: Intubation Date/Time: 10/02/2015 12:52 PM Performed by: Dione Booze Pre-anesthesia Checklist: Emergency Drugs available, Patient identified, Suction available and Patient being monitored Patient Re-evaluated:Patient Re-evaluated prior to inductionOxygen Delivery Method: Circle system utilized Preoxygenation: Pre-oxygenation with 100% oxygen Intubation Type: IV induction Laryngoscope Size: Mac and 4 Grade View: Grade II Tube type: Oral Tube size: 7.5 mm Number of attempts: 1 Airway Equipment and Method: Stylet Placement Confirmation: ETT inserted through vocal cords under direct vision,  breath sounds checked- equal and bilateral and positive ETCO2 Secured at: 21 cm Tube secured with: Tape Dental Injury: Teeth and Oropharynx as per pre-operative assessment

## 2015-10-02 NOTE — Progress Notes (Signed)
[facility] Progress Note   Kristin Valentine  [id number]  DOB: 11-May-1946  DOA: 10/01/2015 PCP: Andria Frames, MD  Brief narrative: Kristin Valentine is a 70 y.o. female pancreatic cancer diagnosed on 09/19/15, obstructive jaundice status post ERCP and stenting, hypertension, hyperlipidemia air the patient presented to the ER for cough, worsening jaundice and itching. She was also noted to have 1 out of 2 blood cultures that were positive for gram-positive cocci in chains and in pairs. She was admitted to the hospital for reassessment of her stent.   Subjective: No complaints of pain. She's had a cough for the past 2 weeks which is getting better. She is itching. No nausea vomiting diarrhea or constipation. No dyspnea.  Assessment/Plan: Principal Problem:   Positive blood culture -Likely contaminant-she had a second blood culture obtained on 2/21 (this was one culture only) and has come back positive as well. Gram-positive cocci in chain - 2 more sets of cultures drawn earlier today-continue to follow - Continue Unasyn  Active Problems: Jaundice/itching-pancreatic cancer -Went to ERCP again today and was noted to have an occluded plastic biliary stent -Patient received a metallic stent -Follow for improvement -Commended to discharge with Augmentin 875 mg twice a day for prophylaxis -Follow-up appointment tomorrow at [place] with pancreatic surgeon    Hyponatremia -Sodium 126-this may be secondary to HCTZ  - according to the patient she has been eating and drinking well-she has already received normal saline and therefore urine osmolality and sodium might be inaccurate and I will hold off on ordering these today    HTN (hypertension) - Due to to cough, Vasotec switch to amlodipine on admission-continue to follow  -Holding HCTZ due to hyponatremia    cough  - Possibly cold/viral bronchitis-improving    Antibiotics: Anti-infectives    Start     Dose/Rate Route Frequency  Ordered Stop   10/02/15 1330  ampicillin-sulbactam (UNASYN) 1.5 g in sodium chloride 0.9 % 50 mL IVPB     1.5 g 100 mL/hr over 30 Minutes Intravenous  Once 10/02/15 1323 10/02/15 1420     Code Status:     Code Status Orders        Start     Ordered   10/01/15 2359  Full code   Continuous     10/01/15 2359    Code Status History    Date Active Date Inactive Code Status Order ID Comments User Context   This patient has a current code status but no historical code status.    Advance Directive Documentation        Most Recent Value   Type of Advance Directive  Healthcare Power of Attorney, Living will   Pre-existing out of facility DNR order (yellow form or pink MOST form)     "MOST" Form in Place?       Family Communication: sister in law at bedside Disposition Plan:hopefully home tomorrow if sodium improved DVT prophylaxis: SCDs  Consultants: GI Procedures: ERCP    Objective: Filed Weights   10/02/15 1519  Weight: 58.5 kg (128 lb 15.5 oz)    Intake/Output Summary (Last 24 hours) at 10/02/15 1714 Last data filed at 10/02/15 1302  Gross per 24 hour  Intake   1500 ml  Output      0 ml  Net   1500 ml     Vitals Filed Vitals:   10/02/15 1350 10/02/15 1400 10/02/15 1410 10/02/15 1519  BP: 112/80 126/69  134/87  Pulse: 88 84 84  83  Temp:    98 F (36.7 C)  TempSrc:    Oral  Resp: 26 23 29 18   Height:    5\' 4"  (1.626 m)  Weight:    58.5 kg (128 lb 15.5 oz)  SpO2: 96% 97% 97% 97%    Exam:  General:  Pt is alert, not in acute distress-severely jaundiced   HEENT: No icterus, No thrush, oral mucosa moist  Cardiovascular: regular rate and rhythm, S1/S2 No murmur  Respiratory: clear to auscultation bilaterally   Abdomen: Soft, +Bowel sounds, non tender, non distended, no guarding  MSK: No cyanosis or clubbing- no pedal edema   Data Reviewed: Basic Metabolic Panel:  Recent Labs Lab 09/30/15 1801 10/01/15 2233 10/02/15 0542  NA 124* 124* 126*  K  3.5 3.5 3.1*  CL 87* 87* 93*  CO2 24 25 24   GLUCOSE 173* 169* 137*  BUN 16 17 12   CREATININE 0.33* <0.30* <0.30*  CALCIUM 8.8* 8.6* 7.8*   Liver Function Tests:  Recent Labs Lab 09/30/15 1801 10/01/15 2233 10/02/15 0542  AST 91* 53* 43*  ALT 158* 104* 82*  ALKPHOS 567* 502* 446*  BILITOT 10.6* 11.8* 10.5*  PROT 7.4 6.6 5.6*  ALBUMIN 3.3* 2.9* 2.4*   No results for input(s): LIPASE, AMYLASE in the last 168 hours. No results for input(s): AMMONIA in the last 168 hours. CBC:  Recent Labs Lab 10/01/15 2233  WBC 8.2  HGB 9.8*  HCT 27.7*  MCV 84.7  PLT 229   Cardiac Enzymes: No results for input(s): CKTOTAL, CKMB, CKMBINDEX, TROPONINI in the last 168 hours. BNP (last 3 results) No results for input(s): BNP in the last 8760 hours.  ProBNP (last 3 results) No results for input(s): PROBNP in the last 8760 hours.  CBG:  Recent Labs Lab 10/02/15 0759  GLUCAP 133*    Recent Results (from the past 240 hour(s))  Culture, blood (routine x 2)     Status: None (Preliminary result)   Collection Time: 09/30/15  6:02 PM  Result Value Ref Range Status   Specimen Description BLOOD LEFT ANTECUBITAL  Final   Special Requests BOTTLES DRAWN AEROBIC AND ANAEROBIC 5ML  Final   Culture   Final    NO GROWTH 2 DAYS Performed at [facility]    Report Status PENDING  Incomplete  Culture, blood (routine x 2)     Status: None (Preliminary result)   Collection Time: 09/30/15  6:05 PM  Result Value Ref Range Status   Specimen Description BLOOD RIGHT ANTECUBITAL  Final   Special Requests BOTTLES DRAWN AEROBIC AND ANAEROBIC 5ML  Final   Culture  Setup Time   Final    GRAM POSITIVE COCCI IN CHAINS IN PAIRS ANAEROBIC BOTTLE ONLY CRITICAL RESULT CALLED TO, READ BACK BY AND VERIFIED WITH: Jomarie Longs RN [id number] 10/01/15 A BROWNING    Culture   Final    GRAM POSITIVE COCCI CULTURE REINCUBATED FOR BETTER GROWTH Performed at [facility]    Report Status PENDING  Incomplete   Culture, blood (routine x 2)     Status: None (Preliminary result)   Collection Time: 10/01/15 10:35 PM  Result Value Ref Range Status   Specimen Description BLOOD RIGHT AC  Final   Special Requests BOTTLES DRAWN AEROBIC AND ANAEROBIC 5CC  Final   Culture  Setup Time   Final    GRAM POSITIVE COCCI IN CHAINS IN BOTH AEROBIC AND ANAEROBIC BOTTLES CRITICAL RESULT CALLED TO, READ BACK BY AND VERIFIED WITH:  A MELTON 10/02/15 @ 1629 M VESTAL    Culture   Final    GRAM POSITIVE COCCI Performed at [facility]    Report Status PENDING  Incomplete  Blood culture (routine x 2)     Status: None (Preliminary result)   Collection Time: 10/02/15 12:59 AM  Result Value Ref Range Status   Specimen Description BLOOD LEFT ANTECUBITAL  Final   Special Requests BOTTLES DRAWN AEROBIC AND ANAEROBIC 5CC  Final   Culture PENDING  Incomplete   Report Status PENDING  Incomplete  Blood culture (routine x 2)     Status: None (Preliminary result)   Collection Time: 10/02/15 12:59 AM  Result Value Ref Range Status   Specimen Description BLOOD LEFT FOREARM  Final   Special Requests   Final    BOTTLES DRAWN AEROBIC AND ANAEROBIC 6CC Performed at [facility]    Culture PENDING  Incomplete   Report Status PENDING  Incomplete     Studies: Dg Chest 2 View  09/30/2015  CLINICAL DATA:  Initial encounter. 70 y/o female with c/o chest discomfort Sat/Sun, dry cough x2 weeks. Recently diagnosed with pancreatic CA, hx HTN - on meds, non-smoker EXAM: CHEST - 2 VIEW COMPARISON:  CT 09/10/2015 FINDINGS: Lungs are clear. Heart size and mediastinal contours are within normal limits. No effusion. Visualized skeletal structures are unremarkable. IMPRESSION: No acute cardiopulmonary disease. Electronically Signed   By: Lucrezia Europe M.D.   On: 09/30/2015 18:26   Dg Ercp  10/02/2015  CLINICAL DATA:  Pancreas carcinoma, biliary obstruction, endoscopic stent exchange EXAM: ERCP COMPARISON:  09/13/2015 FLUOROSCOPY  TIME:  Radiation Exposure Index (as provided by the fluoroscopic device): Not available If the device does not provide the exposure index: Fluoroscopy Time:  2 minutes 1 second Number of Acquired Images:  2 FINDINGS: Spot fluoroscopic intraoperative views demonstrate removal of the temporary endoscopic stent insertion for a covered metallic wall flex stent within the distal CBD. Biliary dilatation persist proximally. IMPRESSION: Successful exchange of a temporary biliary stent for a more permanent covered wall flex stent Electronically Signed   By: Jerilynn Mages.  Shick M.D.   On: 10/02/2015 15:10    Scheduled Meds:  Scheduled Meds: . amLODipine  5 mg Oral Daily  . dextromethorphan-guaiFENesin  1 tablet Oral BID  . sodium chloride flush  3 mL Intravenous Q12H   Continuous Infusions: . sodium chloride 100 mL/hr at 10/02/15 1649    Time spent on care of this patient 29 min   [place], MD 10/02/2015, 5:14 PM  LOS: 1 day   [facility] Office  [phone] Pager - Text Page per [email or web address] If 7PM-7AM, please contact night-coverage [email or web address]

## 2015-10-02 NOTE — Anesthesia Postprocedure Evaluation (Signed)
Anesthesia Post Note  Patient: Kristin Valentine  Procedure(s) Performed: Procedure(s) (LRB): ENDOSCOPIC RETROGRADE CHOLANGIOPANCREATOGRAPHY (ERCP) (N/A)  Patient location during evaluation: PACU Anesthesia Type: General Level of consciousness: sedated Pain management: satisfactory to patient Vital Signs Assessment: post-procedure vital signs reviewed and stable Respiratory status: spontaneous breathing Cardiovascular status: stable Anesthetic complications: no    Last Vitals:  Filed Vitals:   10/02/15 1400 10/02/15 1410  BP: 126/69   Pulse: 84 84  Temp:    Resp: 23 29    Last Pain:  Filed Vitals:   10/02/15 1410  PainSc: 0-No pain                 Domenic Schoenberger EDWARD

## 2015-10-03 ENCOUNTER — Encounter (HOSPITAL_COMMUNITY): Payer: Self-pay | Admitting: Gastroenterology

## 2015-10-03 DIAGNOSIS — R918 Other nonspecific abnormal finding of lung field: Secondary | ICD-10-CM | POA: Diagnosis not present

## 2015-10-03 DIAGNOSIS — E782 Mixed hyperlipidemia: Secondary | ICD-10-CM | POA: Diagnosis not present

## 2015-10-03 DIAGNOSIS — R935 Abnormal findings on diagnostic imaging of other abdominal regions, including retroperitoneum: Secondary | ICD-10-CM | POA: Diagnosis not present

## 2015-10-03 DIAGNOSIS — C257 Malignant neoplasm of other parts of pancreas: Secondary | ICD-10-CM

## 2015-10-03 DIAGNOSIS — K830 Cholangitis: Secondary | ICD-10-CM | POA: Diagnosis not present

## 2015-10-03 DIAGNOSIS — I10 Essential (primary) hypertension: Secondary | ICD-10-CM | POA: Diagnosis not present

## 2015-10-03 DIAGNOSIS — Z95828 Presence of other vascular implants and grafts: Secondary | ICD-10-CM | POA: Diagnosis not present

## 2015-10-03 DIAGNOSIS — K838 Other specified diseases of biliary tract: Secondary | ICD-10-CM | POA: Diagnosis not present

## 2015-10-03 DIAGNOSIS — C250 Malignant neoplasm of head of pancreas: Secondary | ICD-10-CM | POA: Diagnosis not present

## 2015-10-03 LAB — COMPREHENSIVE METABOLIC PANEL
ALBUMIN: 2.3 g/dL — AB (ref 3.5–5.0)
ALT: 71 U/L — ABNORMAL HIGH (ref 14–54)
ANION GAP: 6 (ref 5–15)
AST: 43 U/L — AB (ref 15–41)
Alkaline Phosphatase: 455 U/L — ABNORMAL HIGH (ref 38–126)
BUN: 9 mg/dL (ref 6–20)
CHLORIDE: 100 mmol/L — AB (ref 101–111)
CO2: 25 mmol/L (ref 22–32)
Calcium: 8.3 mg/dL — ABNORMAL LOW (ref 8.9–10.3)
Creatinine, Ser: <0.3 mg/dL — ABNORMAL LOW (ref 0.44–1.00)
GLUCOSE: 132 mg/dL — AB (ref 65–99)
POTASSIUM: 3.7 mmol/L (ref 3.5–5.1)
SODIUM: 131 mmol/L — AB (ref 135–145)
Total Bilirubin: 11 mg/dL — ABNORMAL HIGH (ref 0.3–1.2)
Total Protein: 5.8 g/dL — ABNORMAL LOW (ref 6.5–8.1)

## 2015-10-03 LAB — CULTURE, BLOOD (ROUTINE X 2)

## 2015-10-03 MED ORDER — DM-GUAIFENESIN ER 30-600 MG PO TB12: 1.0000 | ORAL_TABLET | Freq: Two times a day (BID) | ORAL | Status: AC

## 2015-10-03 MED ORDER — AMOXICILLIN-POT CLAVULANATE 875-125 MG PO TABS: 1.0000 | ORAL_TABLET | Freq: Two times a day (BID) | ORAL | Status: AC

## 2015-10-03 MED ORDER — AMOXICILLIN-POT CLAVULANATE 875-125 MG PO TABS
1.0000 | ORAL_TABLET | Freq: Two times a day (BID) | ORAL | Status: DC
  Administered 2015-10-03: 1 via ORAL
  Filled 2015-10-03: qty 1

## 2015-10-03 NOTE — Discharge Summary (Signed)
Physician Discharge Summary  Kristin Valentine [id number] DOB: August 28, 1945 DOA: 10/01/2015  PCP: Andria Frames, MD  Admit date: 10/01/2015 Discharge date: 10/03/2015  Time spent: 50 minutes  Recommendations for Outpatient Follow-up:  1. Check Na+ in 1-2 wks   Discharge Condition: stable    Discharge Diagnoses:  Principal Problem:   Positive blood culture Active Problems:   HTN (hypertension)   Hyperlipidemia, mixed   Pancreatic cancer ([place])   Cough   Hyperbilirubinemia   Hyponatremia   History of present illness:  Kristin Valentine is a 70 y.o. female pancreatic cancer diagnosed on 09/19/15, obstructive jaundice status post ERCP and stenting, hypertension, hyperlipidemia air the patient presented to the ER for cough, worsening jaundice and itching. She was also noted to have 1 out of 2 blood cultures that were positive for gram-positive cocci in chains and in pairs. She was admitted to the hospital for reassessment of her stent.  Hospital Course:  Principal Problem:  Positive blood culture -one out of 2 sets of cultures from 2/20 growing Microaerophillic Strep - she had a second blood culture obtained on 2/21 (this was one culture only) and has come back positive for strep Viridans - 2 more sets of cultures drawn on 2/22 are still negative  - Unasyn switched to Augmentin on discharge  Active Problems: Jaundice/itching-pancreatic cancer -Underwent ERCP 2/22 and was noted to have an occluded plastic biliary stent -Patient received a metallic stent -Follow for improvement as outpt -GI recommended to discharge with Augmentin 875 mg twice a day for prophylaxis -Follow-up appointment today at [facility] with pancreatic surgeon   Hyponatremia -Sodium 126-this may be secondary to HCTZ  - according to the patient she has been eating and drinking well-she has already received normal saline and therefore urine osmolality and sodium might be inaccurate therefore I did not perform these  studies - Sodium improved with NS infusion to 131 - cont to hold HCTZ and repeat Sodium in 1-2 wks   HTN (hypertension) -Cont Vasotec -Holding HCTZ due to hyponatremia   Cough  - Possibly cold/viral bronchitis-improving   Procedures:  ERCP  Consultations:  GI- Dr Benson [place]  Discharge Exam: [facility] Weights   10/02/15 1519  Weight: 58.5 kg (128 lb 15.5 oz)   Filed Vitals:   10/03/15 0549 10/03/15 0904  BP: 124/78 137/80  Pulse: 76   Temp: 97.6 F (36.4 C)   Resp: 20     General: AAO x 3, no distress, severely jaundiced Cardiovascular: RRR, no murmurs  Respiratory: clear to auscultation bilaterally GI: soft, non-tender, non-distended, bowel sound positive  Discharge Instructions You were cared for by a hospitalist during your hospital stay. If you have any questions about your discharge medications or the care you received while you were in the hospital after you are discharged, you can call the unit and asked to speak with the hospitalist on call if the hospitalist that took care of you is not available. Once you are discharged, your primary care physician will handle any further medical issues. Please note that NO REFILLS for any discharge medications will be authorized once you are discharged, as it is imperative that you return to your primary care physician (or establish a relationship with a primary care physician if you do not have one) for your aftercare needs so that they can reassess your need for medications and monitor your lab values.  Discharge Instructions    Discharge instructions    Complete by:  As directed   Your sodium  was 126. Stop HCTZ. Check your BP daily as discussed and take readings to PCP. See PCP in 1 wk and have BP and sodium level checked.  Take a low fat diet.     Increase activity slowly    Complete by:  As directed             Medication List    STOP taking these medications        hydrochlorothiazide 25 MG tablet  Commonly known as:   HYDRODIURIL      TAKE these medications        amoxicillin-clavulanate 875-125 MG tablet  Commonly known as:  AUGMENTIN  Take 1 tablet by mouth every 12 (twelve) hours.     dextromethorphan-guaiFENesin 30-600 MG 12hr tablet  Commonly known as:  MUCINEX DM  Take 1 tablet by mouth 2 (two) times daily.     enalapril 20 MG tablet  Commonly known as:  VASOTEC  TAKE ONE TABLET BY MOUTH EVERY DAY     pravastatin 20 MG tablet  Commonly known as:  PRAVACHOL  TAKE ONE TABLET BY MOUTH EVERY DAY       No Known Allergies    The results of significant diagnostics from this hospitalization (including imaging, microbiology, ancillary and laboratory) are listed below for reference.    Significant Diagnostic Studies: Dg Chest 2 View  09/30/2015  CLINICAL DATA:  Initial encounter. 70 y/o female with c/o chest discomfort Sat/Sun, dry cough x2 weeks. Recently diagnosed with pancreatic CA, hx HTN - on meds, non-smoker EXAM: CHEST - 2 VIEW COMPARISON:  CT 09/10/2015 FINDINGS: Lungs are clear. Heart size and mediastinal contours are within normal limits. No effusion. Visualized skeletal structures are unremarkable. IMPRESSION: No acute cardiopulmonary disease. Electronically Signed   By: Lucrezia Europe M.D.   On: 09/30/2015 18:26   Ct Abdomen Pelvis W Contrast  09/10/2015  CLINICAL DATA:  Painless jaundice. Elevated liver function tests. Symptoms for 4 days. EXAM: CT ABDOMEN AND PELVIS WITH CONTRAST TECHNIQUE: Multidetector CT imaging of the abdomen and pelvis was performed using the standard protocol following bolus administration of intravenous contrast. CONTRAST:  185mL ISOVUE-300 IOPAMIDOL (ISOVUE-300) INJECTION 61% COMPARISON:  None. FINDINGS: Lower chest: Minimal motion degradation at the lung bases. Volume loss in the lingula. Heart size upper normal, without pericardial or pleural effusion. Hepatobiliary: No focal liver lesion. Gallbladder distention, without stone or pericholecystic edema. Moderate to  marked intrahepatic biliary duct dilatation. The common duct is moderately dilated, including at 1.7 cm in the porta hepatis. This continues to the level of the pancreatic head process detailed below. No evidence of choledocholithiasis. Pancreas: Pancreatic duct dilatation is moderate. Both the pancreatic and common duct dilatation continue to the level of a subtle area of hypoattenuation within the pancreatic head. This may extend laterally to include the adjacent duodenum. Measures on the order of 2.0 x 2.4 cm on image 30/series 2. Spleen: Normal in size, without focal abnormality. Adrenals/Urinary Tract: Mild left adrenal nodularity. Normal right adrenal gland. Normal kidneys, without hydronephrosis. Normal urinary bladder. Stomach/Bowel: Normal stomach, without wall thickening. Minimal motion degradation continuing into the abdomen and pelvis. Normal terminal ileum. Otherwise normal small bowel. Vascular/Lymphatic: Normal aortic caliber. The celiac is uninvolved on image 19/ series 2. The superior mesenteric artery is free of tumor on image 23/series 2. No venous involvement with tumor. No retroperitoneal/ peripancreatic adenopathy. No pelvic sidewall adenopathy. Reproductive: Normal uterus and adnexa. Other: No significant free fluid. No evidence of omental or peritoneal disease. Musculoskeletal: Degenerative  disc disease at L4-5 and L3-4. Disc bulge at these levels as well as at L2-3 IMPRESSION: 1. Mildly motion degraded exam throughout. 2. Findings most consistent with a subtle adenocarcinoma involving the pancreatic head. Resultant biliary and pancreatic duct dilatation. 3. No findings to preclude surgical resection. If the patient is a surgical candidate clinically, pre and post contrast abdominal MRI may be informative to exclude otherwise occult liver metastasis. Electronically Signed   By: Abigail Miyamoto M.D.   On: 09/10/2015 13:17   Dg Ercp  10/02/2015  CLINICAL DATA:  Pancreas carcinoma, biliary  obstruction, endoscopic stent exchange EXAM: ERCP COMPARISON:  09/13/2015 FLUOROSCOPY TIME:  Radiation Exposure Index (as provided by the fluoroscopic device): Not available If the device does not provide the exposure index: Fluoroscopy Time:  2 minutes 1 second Number of Acquired Images:  2 FINDINGS: Spot fluoroscopic intraoperative views demonstrate removal of the temporary endoscopic stent insertion for a covered metallic wall flex stent within the distal CBD. Biliary dilatation persist proximally. IMPRESSION: Successful exchange of a temporary biliary stent for a more permanent covered wall flex stent Electronically Signed   By: Jerilynn Mages.  Shick M.D.   On: 10/02/2015 15:10   Dg Ercp Biliary & Pancreatic Ducts  09/13/2015  CLINICAL DATA:  Biliary obstruction and pancreatic mass. EXAM: ERCP TECHNIQUE: Multiple spot images obtained with the fluoroscopic device and submitted for interpretation post-procedure. COMPARISON:  CT of the abdomen and pelvis on 09/10/2015 FINDINGS: Imaging obtained with a C-arm during ERCP demonstrates wire placement into the pancreatic duct. Cannulation of the common bile duct was then performed which appears dilated. 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 An endoscopic biliary stent was placed spanning across the stricture. 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 These images were submitted for radiologic interpretation only. Please see the procedural report for the amount of contrast and the fluoroscopy time utilized. Electronically Signed   By: Aletta Edouard M.D.   On: 09/13/2015 11:37    Microbiology: Recent Results (from the past 240 hour(s))  Culture, blood (routine x 2)     Status: None (Preliminary result)   Collection Time: 09/30/15  6:02 PM  Result Value Ref Range Status   Specimen Description BLOOD LEFT ANTECUBITAL  Final   Special Requests BOTTLES DRAWN AEROBIC AND ANAEROBIC 5ML  Final   Culture   Final    NO GROWTH 3  DAYS Performed at [facility]    Report Status PENDING  Incomplete  Culture, blood (routine x 2)     Status: None   Collection Time: 09/30/15  6:05 PM  Result Value Ref Range Status   Specimen Description BLOOD RIGHT ANTECUBITAL  Final   Special Requests BOTTLES DRAWN AEROBIC AND ANAEROBIC 5ML  Final   Culture  Setup Time   Final    GRAM POSITIVE COCCI IN CHAINS IN PAIRS ANAEROBIC BOTTLE ONLY CRITICAL RESULT CALLED TO, READ BACK BY AND VERIFIED WITH: Jomarie Longs RN [id number] 10/01/15 A BROWNING    Culture   Final    MICROAEROPHILIC STREPTOCOCCI Standardized susceptibility testing for this organism is not available. Performed at [facility]    Report Status 10/03/2015 FINAL  Final  Culture, blood (routine x 2)     Status: None (Preliminary result)   Collection Time: 10/01/15 10:35 PM  Result Value Ref Range Status   Specimen Description BLOOD RIGHT [facility]  Final   Special Requests BOTTLES DRAWN AEROBIC AND ANAEROBIC 5CC  Final   Culture  Setup  Time   Final    GRAM POSITIVE COCCI IN CHAINS IN BOTH AEROBIC AND ANAEROBIC BOTTLES CRITICAL RESULT CALLED TO, READ BACK BY AND VERIFIED WITH: A MELTON 10/02/15 @ 10 M VESTAL    Culture   Final    VIRIDANS STREPTOCOCCUS THE SIGNIFICANCE OF ISOLATING THIS ORGANISM FROM A SINGLE SET OF BLOOD CULTURES WHEN MULTIPLE SETS ARE DRAWN IS UNCERTAIN. PLEASE NOTIFY THE MICROBIOLOGY DEPARTMENT WITHIN ONE WEEK IF SPECIATION AND SENSITIVITIES ARE REQUIRED. Performed at [facility]    Report Status PENDING  Incomplete  Blood culture (routine x 2)     Status: None (Preliminary result)   Collection Time: 10/02/15 12:59 AM  Result Value Ref Range Status   Specimen Description BLOOD LEFT ANTECUBITAL  Final   Special Requests BOTTLES DRAWN AEROBIC AND ANAEROBIC 5CC  Final   Culture   Final    NO GROWTH 1 DAY Performed at [facility]    Report Status PENDING  Incomplete  Blood culture (routine x 2)     Status: None (Preliminary  result)   Collection Time: 10/02/15 12:59 AM  Result Value Ref Range Status   Specimen Description BLOOD LEFT FOREARM  Final   Special Requests BOTTLES DRAWN AEROBIC AND ANAEROBIC [place]  Final   Culture   Final    NO GROWTH 1 DAY Performed at [facility]    Report Status PENDING  Incomplete     Labs: Basic Metabolic Panel:  Recent Labs Lab 09/30/15 1801 10/01/15 2233 10/02/15 0542 10/02/15 1803 10/03/15 0447  NA 124* 124* 126* 128* 131*  K 3.5 3.5 3.1* 3.5 3.7  CL 87* 87* 93* 96* 100*  CO2 24 25 24 23 25   GLUCOSE 173* 169* 137* 196* 132*  BUN 16 17 12 11 9   CREATININE 0.33* <0.30* <0.30* <0.30* <0.30*  CALCIUM 8.8* 8.6* 7.8* 7.8* 8.3*   Liver Function Tests:  Recent Labs Lab 09/30/15 1801 10/01/15 2233 10/02/15 0542 10/03/15 0447  AST 91* 53* 43* 43*  ALT 158* 104* 82* 71*  ALKPHOS 567* 502* 446* 455*  BILITOT 10.6* 11.8* 10.5* 11.0*  PROT 7.4 6.6 5.6* 5.8*  ALBUMIN 3.3* 2.9* 2.4* 2.3*   No results for input(s): LIPASE, AMYLASE in the last 168 hours. No results for input(s): AMMONIA in the last 168 hours. CBC:  Recent Labs Lab 10/01/15 2233  WBC 8.2  HGB 9.8*  HCT 27.7*  MCV 84.7  PLT 229   Cardiac Enzymes: No results for input(s): CKTOTAL, CKMB, CKMBINDEX, TROPONINI in the last 168 hours. BNP: BNP (last 3 results) No results for input(s): BNP in the last 8760 hours.  ProBNP (last 3 results) No results for input(s): PROBNP in the last 8760 hours.  CBG:  Recent Labs Lab 10/02/15 0759  GLUCAP 133*       SignedDebbe Odea, MD [facility] 10/03/2015, 5:06 PM

## 2015-10-03 NOTE — Progress Notes (Signed)
Patient admitted to taking home medication 25 mg Hydrochlorothiazide and 20 mg enalapril. This was after I already administered Norvasc per order. BP is 137/80. Patient educated and medications sent home with family member.  Will continue to monitor BP throughout the day. Sent page to Dr. Wynelle Cleveland.

## 2015-10-03 NOTE — Progress Notes (Signed)
Subjective: Pruritis is a little better.  Objective: Vital signs in last 24 hours: Temp:  [97.6 F (36.4 C)-98.6 F (37 C)] 97.6 F (36.4 C) (02/23 0549) Pulse Rate:  [74-99] 76 (02/23 0549) Resp:  [17-29] 20 (02/23 0549) BP: (112-143)/(69-91) 124/78 mmHg (02/23 0549) SpO2:  [96 %-100 %] 98 % (02/23 0549) Weight:  [58.5 kg (128 lb 15.5 oz)] 58.5 kg (128 lb 15.5 oz) (02/22 1519) Last BM Date: 10/01/15  Intake/Output from previous day: 02/22 0701 - 02/23 0700 In: 2283.3 [P.O.:480; I.V.:1803.3] Out: 1450 [Urine:1450] Intake/Output this shift:    General appearance: alert and no distress GI: soft, non-tender; bowel sounds normal; no masses,  no organomegaly  Lab Results:  Recent Labs  10/01/15 2233  WBC 8.2  HGB 9.8*  HCT 27.7*  PLT 229   BMET  Recent Labs  10/02/15 0542 10/02/15 1803 10/03/15 0447  NA 126* 128* 131*  K 3.1* 3.5 3.7  CL 93* 96* 100*  CO2 24 23 25   GLUCOSE 137* 196* 132*  BUN 12 11 9   CREATININE <0.30* <0.30* <0.30*  CALCIUM 7.8* 7.8* 8.3*   LFT  Recent Labs  10/03/15 0447  PROT 5.8*  ALBUMIN 2.3*  AST 43*  ALT 71*  ALKPHOS 455*  BILITOT 11.0*   PT/INR  Recent Labs  10/01/15 2241  LABPROT 14.3  INR 1.14   Hepatitis Panel No results for input(s): HEPBSAG, HCVAB, HEPAIGM, HEPBIGM in the last 72 hours. C-Diff No results for input(s): CDIFFTOX in the last 72 hours. Fecal Lactopherrin No results for input(s): FECLLACTOFRN in the last 72 hours.  Studies/Results: Dg Ercp  10/02/2015  CLINICAL DATA:  Pancreas carcinoma, biliary obstruction, endoscopic stent exchange EXAM: ERCP COMPARISON:  09/13/2015 FLUOROSCOPY TIME:  Radiation Exposure Index (as provided by the fluoroscopic device): Not available If the device does not provide the exposure index: Fluoroscopy Time:  2 minutes 1 second Number of Acquired Images:  2 FINDINGS: Spot fluoroscopic intraoperative views demonstrate removal of the temporary endoscopic stent insertion for a  covered metallic wall flex stent within the distal CBD. Biliary dilatation persist proximally. IMPRESSION: Successful exchange of a temporary biliary stent for a more permanent covered wall flex stent Electronically Signed   By: Jerilynn Mages.  Shick M.D.   On: 10/02/2015 15:10    Medications:  Scheduled: . amLODipine  5 mg Oral Daily  . dextromethorphan-guaiFENesin  1 tablet Oral BID  . sodium chloride flush  3 mL Intravenous Q12H   Continuous: . sodium chloride 100 mL/hr at 10/02/15 1649    Assessment/Plan: 1) Pancreatic adenocarcinoma. 2) Jaundice s/p metallic biliary stent placement. 3) Gram+ cocci in chains suggestive of bacteremia.   The patient remains clinically stable.  Despite the mild increase in her TB, she reports that her pruritis has improved.  I am hoping that she will have adequate drainage.  The mucus in the CBD is still a concern as it can slow down drainage in a large stent.  No documentation of any fever.  Per her daughter's request, she insists that her mother go to [facility] today for her scheduled staging scans.  Her daughter declined the scans here at [facility]  Two blood cultures reporting gram + cocci in chains.  No speciation or resistance profile yet.  I think it is best to have her discharged with Augmentin 875 mg BID and then to adjust pending the results of the incubation.  Plan: 1) D/C this AM so she can make her scheduled appointment at [facility]  at 3 PM. 2) D/C with Augmentin 875 mg BID x 7-10 days.   LOS: 2 days   Lockie Bothun D 10/03/2015, 7:16 AM

## 2015-10-04 DIAGNOSIS — K838 Other specified diseases of biliary tract: Secondary | ICD-10-CM | POA: Diagnosis not present

## 2015-10-04 DIAGNOSIS — Z95828 Presence of other vascular implants and grafts: Secondary | ICD-10-CM | POA: Diagnosis not present

## 2015-10-04 DIAGNOSIS — C250 Malignant neoplasm of head of pancreas: Secondary | ICD-10-CM | POA: Diagnosis not present

## 2015-10-04 DIAGNOSIS — I10 Essential (primary) hypertension: Secondary | ICD-10-CM | POA: Diagnosis not present

## 2015-10-04 DIAGNOSIS — R918 Other nonspecific abnormal finding of lung field: Secondary | ICD-10-CM | POA: Diagnosis not present

## 2015-10-04 DIAGNOSIS — K830 Cholangitis: Secondary | ICD-10-CM | POA: Diagnosis not present

## 2015-10-04 DIAGNOSIS — E782 Mixed hyperlipidemia: Secondary | ICD-10-CM | POA: Diagnosis not present

## 2015-10-04 DIAGNOSIS — R935 Abnormal findings on diagnostic imaging of other abdominal regions, including retroperitoneum: Secondary | ICD-10-CM | POA: Diagnosis not present

## 2015-10-04 LAB — CULTURE, BLOOD (ROUTINE X 2)

## 2015-10-05 LAB — CULTURE, BLOOD (ROUTINE X 2): CULTURE: NO GROWTH

## 2015-10-07 LAB — CULTURE, BLOOD (ROUTINE X 2)
CULTURE: NO GROWTH
Culture: NO GROWTH

## 2015-10-14 DIAGNOSIS — C250 Malignant neoplasm of head of pancreas: Secondary | ICD-10-CM | POA: Diagnosis not present

## 2015-10-18 DIAGNOSIS — Z01818 Encounter for other preprocedural examination: Secondary | ICD-10-CM | POA: Diagnosis not present

## 2015-10-18 DIAGNOSIS — I10 Essential (primary) hypertension: Secondary | ICD-10-CM | POA: Diagnosis not present

## 2015-10-18 DIAGNOSIS — E871 Hypo-osmolality and hyponatremia: Secondary | ICD-10-CM | POA: Diagnosis not present

## 2015-10-18 DIAGNOSIS — C250 Malignant neoplasm of head of pancreas: Secondary | ICD-10-CM | POA: Diagnosis not present

## 2015-10-18 DIAGNOSIS — R011 Cardiac murmur, unspecified: Secondary | ICD-10-CM | POA: Diagnosis not present

## 2015-10-18 DIAGNOSIS — D649 Anemia, unspecified: Secondary | ICD-10-CM | POA: Diagnosis not present

## 2015-10-22 DIAGNOSIS — K859 Acute pancreatitis without necrosis or infection, unspecified: Secondary | ICD-10-CM | POA: Diagnosis not present

## 2015-10-22 DIAGNOSIS — E891 Postprocedural hypoinsulinemia: Secondary | ICD-10-CM | POA: Diagnosis not present

## 2015-10-22 DIAGNOSIS — I10 Essential (primary) hypertension: Secondary | ICD-10-CM | POA: Diagnosis not present

## 2015-10-22 DIAGNOSIS — C772 Secondary and unspecified malignant neoplasm of intra-abdominal lymph nodes: Secondary | ICD-10-CM | POA: Diagnosis not present

## 2015-10-22 DIAGNOSIS — R109 Unspecified abdominal pain: Secondary | ICD-10-CM | POA: Diagnosis not present

## 2015-10-22 DIAGNOSIS — R011 Cardiac murmur, unspecified: Secondary | ICD-10-CM | POA: Diagnosis not present

## 2015-10-22 DIAGNOSIS — G8918 Other acute postprocedural pain: Secondary | ICD-10-CM | POA: Diagnosis not present

## 2015-10-22 DIAGNOSIS — Z79899 Other long term (current) drug therapy: Secondary | ICD-10-CM | POA: Diagnosis not present

## 2015-10-22 DIAGNOSIS — R5082 Postprocedural fever: Secondary | ICD-10-CM | POA: Diagnosis not present

## 2015-10-22 DIAGNOSIS — K812 Acute cholecystitis with chronic cholecystitis: Secondary | ICD-10-CM | POA: Diagnosis not present

## 2015-10-22 DIAGNOSIS — C250 Malignant neoplasm of head of pancreas: Secondary | ICD-10-CM | POA: Diagnosis not present

## 2015-10-22 DIAGNOSIS — R7303 Prediabetes: Secondary | ICD-10-CM | POA: Diagnosis not present

## 2015-10-22 DIAGNOSIS — Z466 Encounter for fitting and adjustment of urinary device: Secondary | ICD-10-CM | POA: Diagnosis not present

## 2015-10-22 DIAGNOSIS — E871 Hypo-osmolality and hyponatremia: Secondary | ICD-10-CM | POA: Diagnosis not present

## 2015-10-22 DIAGNOSIS — E784 Other hyperlipidemia: Secondary | ICD-10-CM | POA: Diagnosis not present

## 2015-10-22 DIAGNOSIS — Z833 Family history of diabetes mellitus: Secondary | ICD-10-CM | POA: Diagnosis not present

## 2015-10-22 DIAGNOSIS — R739 Hyperglycemia, unspecified: Secondary | ICD-10-CM | POA: Diagnosis not present

## 2015-11-01 DIAGNOSIS — R188 Other ascites: Secondary | ICD-10-CM | POA: Diagnosis not present

## 2015-11-01 DIAGNOSIS — Z794 Long term (current) use of insulin: Secondary | ICD-10-CM | POA: Diagnosis not present

## 2015-11-01 DIAGNOSIS — K9189 Other postprocedural complications and disorders of digestive system: Secondary | ICD-10-CM | POA: Diagnosis not present

## 2015-11-01 DIAGNOSIS — Z4803 Encounter for change or removal of drains: Secondary | ICD-10-CM | POA: Diagnosis not present

## 2015-11-01 DIAGNOSIS — R109 Unspecified abdominal pain: Secondary | ICD-10-CM | POA: Diagnosis not present

## 2015-11-01 DIAGNOSIS — R0602 Shortness of breath: Secondary | ICD-10-CM | POA: Diagnosis not present

## 2015-11-01 DIAGNOSIS — E7800 Pure hypercholesterolemia, unspecified: Secondary | ICD-10-CM | POA: Diagnosis not present

## 2015-11-01 DIAGNOSIS — R110 Nausea: Secondary | ICD-10-CM | POA: Diagnosis not present

## 2015-11-01 DIAGNOSIS — I10 Essential (primary) hypertension: Secondary | ICD-10-CM | POA: Diagnosis not present

## 2015-11-01 DIAGNOSIS — J810 Acute pulmonary edema: Secondary | ICD-10-CM | POA: Diagnosis not present

## 2015-11-01 DIAGNOSIS — R1084 Generalized abdominal pain: Secondary | ICD-10-CM | POA: Diagnosis not present

## 2015-11-01 DIAGNOSIS — A4159 Other Gram-negative sepsis: Secondary | ICD-10-CM | POA: Diagnosis not present

## 2015-11-01 DIAGNOSIS — R739 Hyperglycemia, unspecified: Secondary | ICD-10-CM | POA: Diagnosis not present

## 2015-11-01 DIAGNOSIS — R1031 Right lower quadrant pain: Secondary | ICD-10-CM | POA: Diagnosis not present

## 2015-11-01 DIAGNOSIS — Z7901 Long term (current) use of anticoagulants: Secondary | ICD-10-CM | POA: Diagnosis not present

## 2015-11-01 DIAGNOSIS — E1165 Type 2 diabetes mellitus with hyperglycemia: Secondary | ICD-10-CM | POA: Diagnosis not present

## 2015-11-01 DIAGNOSIS — R7303 Prediabetes: Secondary | ICD-10-CM | POA: Diagnosis not present

## 2015-11-01 DIAGNOSIS — Z9889 Other specified postprocedural states: Secondary | ICD-10-CM | POA: Diagnosis not present

## 2015-11-01 DIAGNOSIS — I898 Other specified noninfective disorders of lymphatic vessels and lymph nodes: Secondary | ICD-10-CM | POA: Diagnosis not present

## 2015-11-01 DIAGNOSIS — R000 Tachycardia, unspecified: Secondary | ICD-10-CM | POA: Diagnosis not present

## 2015-11-01 DIAGNOSIS — E871 Hypo-osmolality and hyponatremia: Secondary | ICD-10-CM | POA: Diagnosis not present

## 2015-11-01 DIAGNOSIS — C250 Malignant neoplasm of head of pancreas: Secondary | ICD-10-CM | POA: Diagnosis not present

## 2015-11-01 DIAGNOSIS — E46 Unspecified protein-calorie malnutrition: Secondary | ICD-10-CM | POA: Diagnosis not present

## 2015-11-01 DIAGNOSIS — E872 Acidosis: Secondary | ICD-10-CM | POA: Diagnosis not present

## 2015-11-01 DIAGNOSIS — K2970 Gastritis, unspecified, without bleeding: Secondary | ICD-10-CM | POA: Diagnosis not present

## 2015-11-01 DIAGNOSIS — A419 Sepsis, unspecified organism: Secondary | ICD-10-CM | POA: Diagnosis not present

## 2015-11-01 DIAGNOSIS — K30 Functional dyspepsia: Secondary | ICD-10-CM | POA: Diagnosis not present

## 2015-11-22 DIAGNOSIS — C250 Malignant neoplasm of head of pancreas: Secondary | ICD-10-CM | POA: Diagnosis not present

## 2015-11-25 DIAGNOSIS — C250 Malignant neoplasm of head of pancreas: Secondary | ICD-10-CM | POA: Diagnosis not present

## 2015-11-25 DIAGNOSIS — K30 Functional dyspepsia: Secondary | ICD-10-CM | POA: Diagnosis not present

## 2015-11-29 DIAGNOSIS — I10 Essential (primary) hypertension: Secondary | ICD-10-CM | POA: Diagnosis not present

## 2015-11-29 DIAGNOSIS — I898 Other specified noninfective disorders of lymphatic vessels and lymph nodes: Secondary | ICD-10-CM | POA: Diagnosis not present

## 2015-11-29 DIAGNOSIS — K8689 Other specified diseases of pancreas: Secondary | ICD-10-CM | POA: Diagnosis not present

## 2015-11-29 DIAGNOSIS — C250 Malignant neoplasm of head of pancreas: Secondary | ICD-10-CM | POA: Diagnosis not present

## 2015-11-29 DIAGNOSIS — E785 Hyperlipidemia, unspecified: Secondary | ICD-10-CM | POA: Diagnosis not present

## 2015-11-29 DIAGNOSIS — Z483 Aftercare following surgery for neoplasm: Secondary | ICD-10-CM | POA: Diagnosis not present

## 2015-11-29 DIAGNOSIS — K30 Functional dyspepsia: Secondary | ICD-10-CM | POA: Diagnosis not present

## 2015-11-29 DIAGNOSIS — Z9889 Other specified postprocedural states: Secondary | ICD-10-CM | POA: Diagnosis not present

## 2015-11-29 DIAGNOSIS — J90 Pleural effusion, not elsewhere classified: Secondary | ICD-10-CM | POA: Diagnosis not present

## 2015-12-01 DIAGNOSIS — K30 Functional dyspepsia: Secondary | ICD-10-CM | POA: Diagnosis not present

## 2015-12-01 DIAGNOSIS — C250 Malignant neoplasm of head of pancreas: Secondary | ICD-10-CM | POA: Diagnosis not present

## 2015-12-05 DIAGNOSIS — Z4803 Encounter for change or removal of drains: Secondary | ICD-10-CM | POA: Diagnosis not present

## 2015-12-05 DIAGNOSIS — K8689 Other specified diseases of pancreas: Secondary | ICD-10-CM | POA: Diagnosis not present

## 2015-12-05 DIAGNOSIS — C250 Malignant neoplasm of head of pancreas: Secondary | ICD-10-CM | POA: Diagnosis not present

## 2015-12-06 DIAGNOSIS — C250 Malignant neoplasm of head of pancreas: Secondary | ICD-10-CM | POA: Diagnosis not present

## 2015-12-08 DIAGNOSIS — Z713 Dietary counseling and surveillance: Secondary | ICD-10-CM | POA: Diagnosis not present

## 2015-12-08 DIAGNOSIS — I10 Essential (primary) hypertension: Secondary | ICD-10-CM | POA: Diagnosis not present

## 2015-12-08 DIAGNOSIS — J90 Pleural effusion, not elsewhere classified: Secondary | ICD-10-CM | POA: Diagnosis not present

## 2015-12-08 DIAGNOSIS — C250 Malignant neoplasm of head of pancreas: Secondary | ICD-10-CM | POA: Diagnosis not present

## 2015-12-08 DIAGNOSIS — T80211A Bloodstream infection due to central venous catheter, initial encounter: Secondary | ICD-10-CM | POA: Diagnosis not present

## 2015-12-08 DIAGNOSIS — E1165 Type 2 diabetes mellitus with hyperglycemia: Secondary | ICD-10-CM | POA: Diagnosis not present

## 2015-12-08 DIAGNOSIS — E871 Hypo-osmolality and hyponatremia: Secondary | ICD-10-CM | POA: Diagnosis not present

## 2015-12-08 DIAGNOSIS — D62 Acute posthemorrhagic anemia: Secondary | ICD-10-CM | POA: Diagnosis not present

## 2015-12-08 DIAGNOSIS — D508 Other iron deficiency anemias: Secondary | ICD-10-CM | POA: Diagnosis not present

## 2015-12-08 DIAGNOSIS — I2699 Other pulmonary embolism without acute cor pulmonale: Secondary | ICD-10-CM | POA: Diagnosis not present

## 2015-12-08 DIAGNOSIS — E7800 Pure hypercholesterolemia, unspecified: Secondary | ICD-10-CM | POA: Diagnosis not present

## 2015-12-08 DIAGNOSIS — J810 Acute pulmonary edema: Secondary | ICD-10-CM | POA: Diagnosis not present

## 2015-12-08 DIAGNOSIS — Z794 Long term (current) use of insulin: Secondary | ICD-10-CM | POA: Diagnosis not present

## 2015-12-08 DIAGNOSIS — A4159 Other Gram-negative sepsis: Secondary | ICD-10-CM | POA: Diagnosis not present

## 2015-12-08 DIAGNOSIS — E872 Acidosis: Secondary | ICD-10-CM | POA: Diagnosis not present

## 2015-12-08 DIAGNOSIS — J189 Pneumonia, unspecified organism: Secondary | ICD-10-CM | POA: Diagnosis not present

## 2016-04-20 IMAGING — CR DG CHEST 2V
2 series · 2 of 2 positions shown · non-contrast
Comparison: CT 09/10/2015

CLINICAL DATA: Initial encounter. 70 y/o female with c/o chest
discomfort Sat/Sun, dry cough x2 weeks. Recently diagnosed with
pancreatic CA, hx HTN - on meds, non-smoker

EXAM:
CHEST - 2 VIEW

[w chest pa]
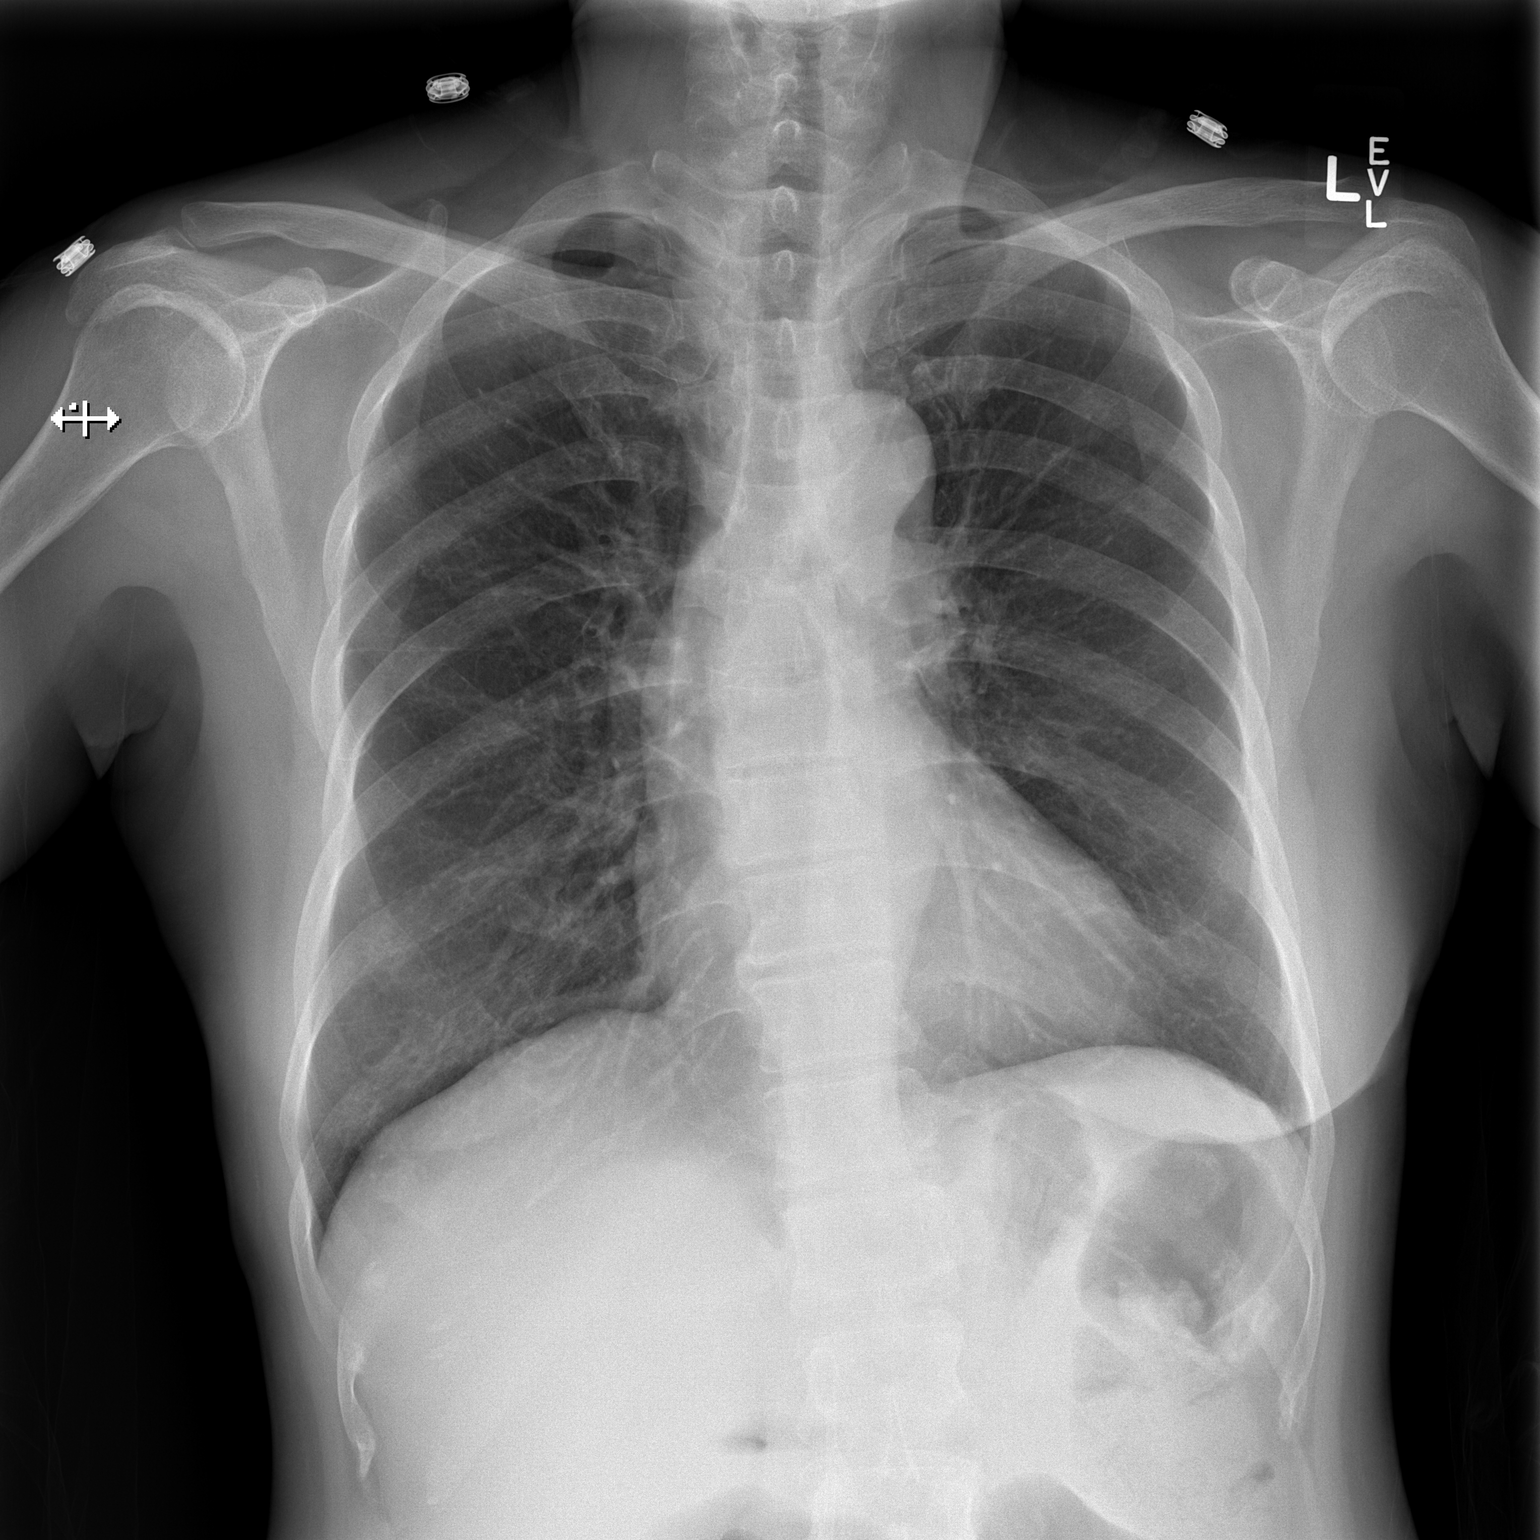

[w chest lat]
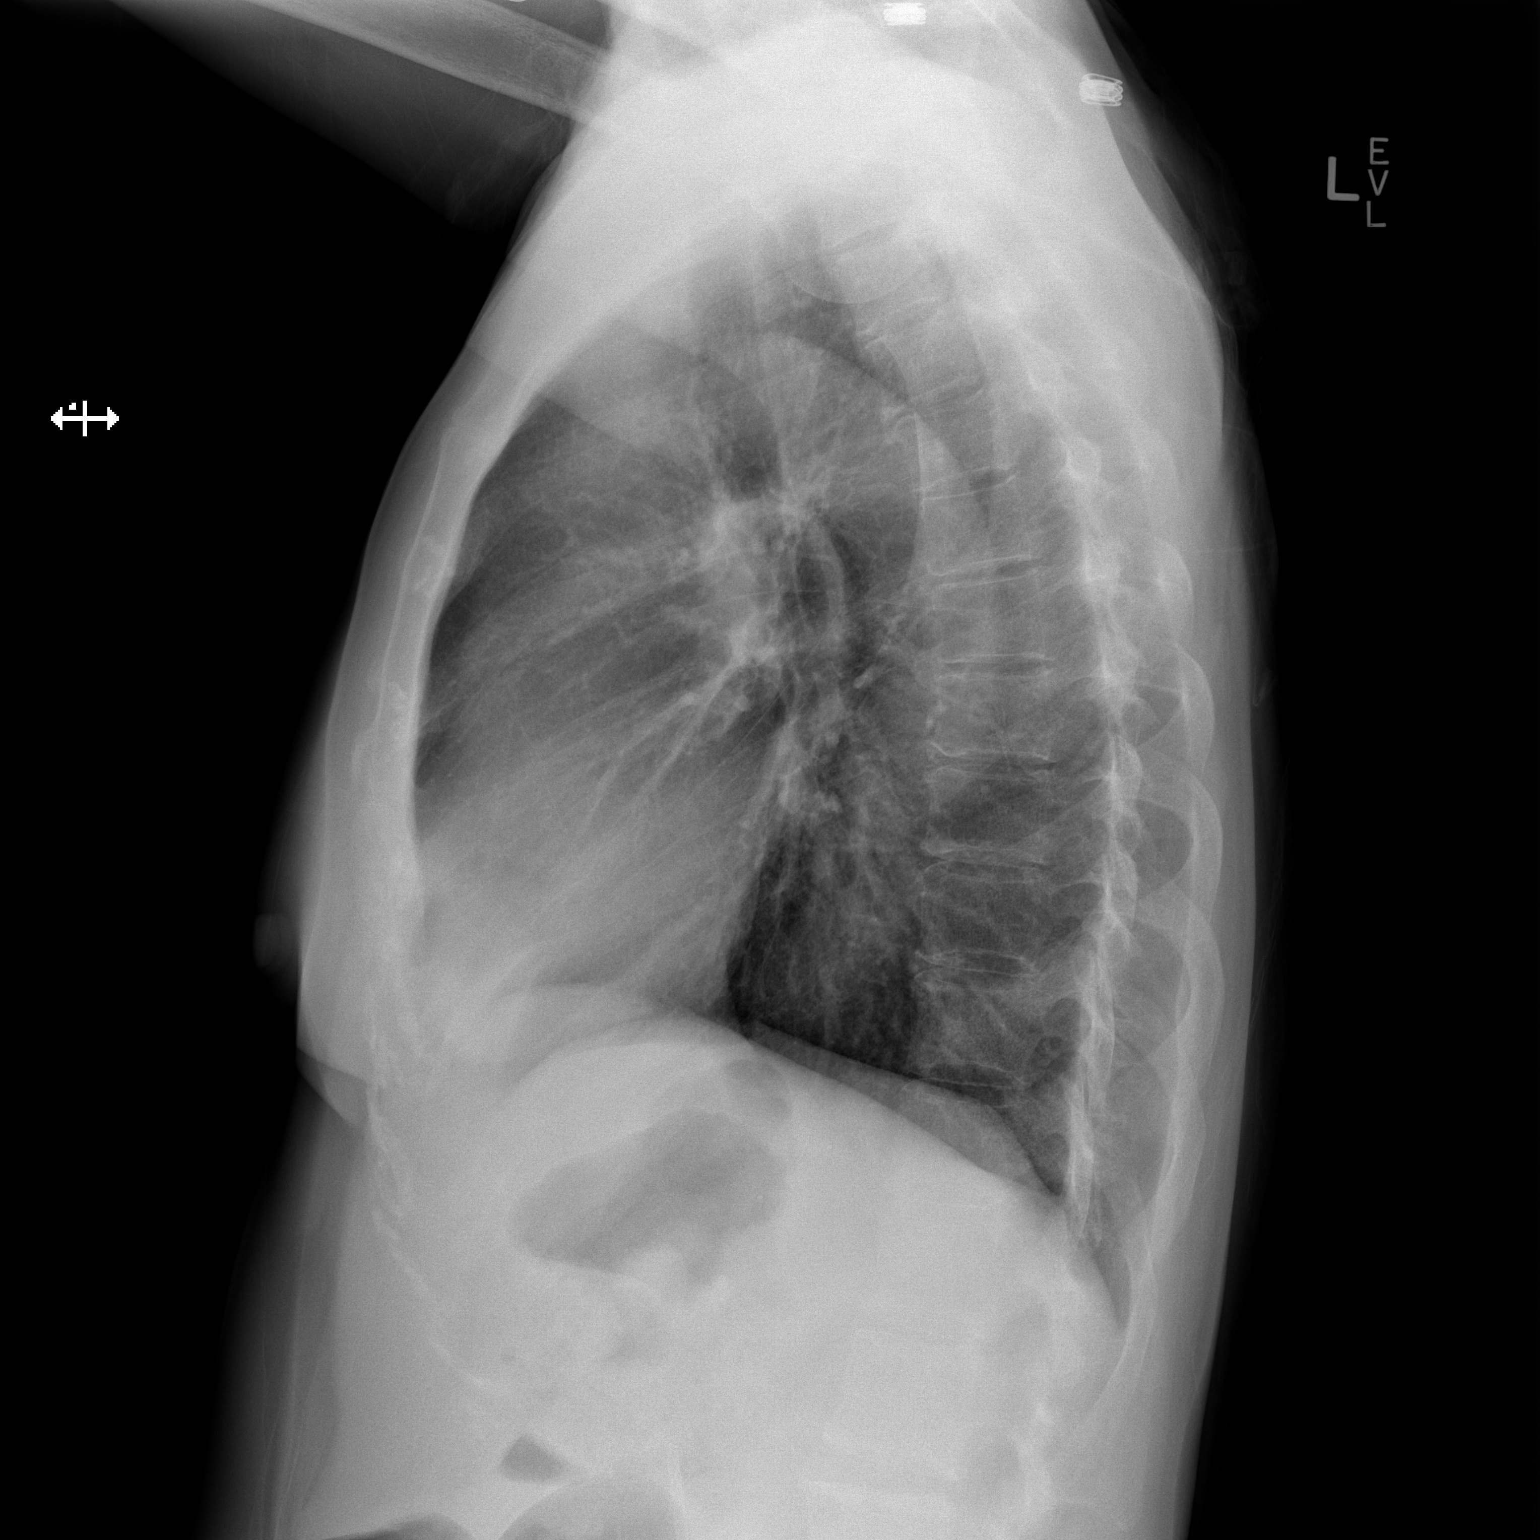

[2 of 2 positions shown; findings below may reference images not displayed]

FINDINGS: Lungs are clear. Heart size and mediastinal contours are within
normal limits.
No effusion.
Visualized skeletal structures are unremarkable.
IMPRESSION: No acute cardiopulmonary disease.

## 2017-05-10 DEATH — deceased
